# Patient Record
Sex: Female | Born: 1970 | Race: Black or African American | Hispanic: No | Marital: Married | Smoking: Never smoker
Health system: Southern US, Community
[De-identification: ages and names within clinical notes are randomized; demographics above are authoritative.]

## PROBLEM LIST (undated history)

## (undated) DIAGNOSIS — E669 Obesity, unspecified: Secondary | ICD-10-CM

## (undated) DIAGNOSIS — T7840XA Allergy, unspecified, initial encounter: Secondary | ICD-10-CM

## (undated) DIAGNOSIS — I10 Essential (primary) hypertension: Secondary | ICD-10-CM

## (undated) HISTORY — DX: Allergy, unspecified, initial encounter: T78.40XA

---

## 2000-11-07 ENCOUNTER — Ambulatory Visit (HOSPITAL_COMMUNITY): Admission: RE | Admit: 2000-11-07 | Discharge: 2000-11-07 | Payer: Self-pay | Admitting: Obstetrics

## 2000-11-07 ENCOUNTER — Inpatient Hospital Stay (HOSPITAL_COMMUNITY): Admission: AD | Admit: 2000-11-07 | Discharge: 2000-11-09 | Payer: Self-pay | Admitting: Obstetrics

## 2000-11-07 ENCOUNTER — Encounter: Payer: Self-pay | Admitting: Obstetrics

## 2000-11-07 ENCOUNTER — Encounter (INDEPENDENT_AMBULATORY_CARE_PROVIDER_SITE_OTHER): Payer: Self-pay

## 2001-05-21 ENCOUNTER — Ambulatory Visit (HOSPITAL_COMMUNITY): Admission: RE | Admit: 2001-05-21 | Discharge: 2001-05-21 | Payer: Self-pay | Admitting: *Deleted

## 2001-05-24 ENCOUNTER — Encounter: Admission: RE | Admit: 2001-05-24 | Discharge: 2001-05-24 | Payer: Self-pay | Admitting: *Deleted

## 2001-06-07 ENCOUNTER — Encounter: Admission: RE | Admit: 2001-06-07 | Discharge: 2001-06-07 | Payer: Self-pay | Admitting: *Deleted

## 2001-06-21 ENCOUNTER — Encounter: Admission: RE | Admit: 2001-06-21 | Discharge: 2001-06-21 | Payer: Self-pay | Admitting: *Deleted

## 2001-07-02 ENCOUNTER — Ambulatory Visit (HOSPITAL_COMMUNITY): Admission: RE | Admit: 2001-07-02 | Discharge: 2001-07-02 | Payer: Self-pay | Admitting: *Deleted

## 2001-07-05 ENCOUNTER — Encounter: Admission: RE | Admit: 2001-07-05 | Discharge: 2001-07-05 | Payer: Self-pay | Admitting: Internal Medicine

## 2001-07-12 ENCOUNTER — Encounter: Admission: RE | Admit: 2001-07-12 | Discharge: 2001-07-12 | Payer: Self-pay | Admitting: *Deleted

## 2001-07-19 ENCOUNTER — Encounter: Admission: RE | Admit: 2001-07-19 | Discharge: 2001-07-19 | Payer: Self-pay | Admitting: *Deleted

## 2001-07-19 ENCOUNTER — Encounter (HOSPITAL_COMMUNITY): Admission: RE | Admit: 2001-07-19 | Discharge: 2001-08-18 | Payer: Self-pay | Admitting: *Deleted

## 2001-07-26 ENCOUNTER — Encounter: Admission: RE | Admit: 2001-07-26 | Discharge: 2001-07-26 | Payer: Self-pay | Admitting: *Deleted

## 2001-08-02 ENCOUNTER — Encounter: Admission: RE | Admit: 2001-08-02 | Discharge: 2001-08-02 | Payer: Self-pay | Admitting: *Deleted

## 2001-08-24 ENCOUNTER — Encounter (HOSPITAL_COMMUNITY): Admission: RE | Admit: 2001-08-24 | Discharge: 2001-09-23 | Payer: Self-pay | Admitting: *Deleted

## 2001-09-24 ENCOUNTER — Inpatient Hospital Stay (HOSPITAL_COMMUNITY): Admission: AD | Admit: 2001-09-24 | Discharge: 2001-09-26 | Payer: Self-pay | Admitting: Family Medicine

## 2003-01-05 ENCOUNTER — Inpatient Hospital Stay (HOSPITAL_COMMUNITY): Admission: AD | Admit: 2003-01-05 | Discharge: 2003-01-05 | Payer: Self-pay | Admitting: Obstetrics & Gynecology

## 2003-01-28 ENCOUNTER — Encounter: Admission: RE | Admit: 2003-01-28 | Discharge: 2003-01-28 | Payer: Self-pay | Admitting: Obstetrics and Gynecology

## 2003-06-18 ENCOUNTER — Emergency Department (HOSPITAL_COMMUNITY): Admission: EM | Admit: 2003-06-18 | Discharge: 2003-06-18 | Payer: Self-pay | Admitting: Emergency Medicine

## 2004-03-23 ENCOUNTER — Inpatient Hospital Stay (HOSPITAL_COMMUNITY): Admission: AD | Admit: 2004-03-23 | Discharge: 2004-03-27 | Payer: Self-pay | Admitting: Obstetrics

## 2004-03-31 ENCOUNTER — Inpatient Hospital Stay (HOSPITAL_COMMUNITY): Admission: AD | Admit: 2004-03-31 | Discharge: 2004-03-31 | Payer: Self-pay | Admitting: Obstetrics and Gynecology

## 2004-09-03 ENCOUNTER — Ambulatory Visit (HOSPITAL_COMMUNITY): Admission: RE | Admit: 2004-09-03 | Discharge: 2004-09-03 | Payer: Self-pay | Admitting: Obstetrics

## 2004-09-15 ENCOUNTER — Emergency Department (HOSPITAL_COMMUNITY): Admission: EM | Admit: 2004-09-15 | Discharge: 2004-09-15 | Payer: Self-pay | Admitting: Family Medicine

## 2004-09-16 ENCOUNTER — Emergency Department (HOSPITAL_COMMUNITY): Admission: EM | Admit: 2004-09-16 | Discharge: 2004-09-16 | Payer: Self-pay | Admitting: Emergency Medicine

## 2004-09-30 ENCOUNTER — Inpatient Hospital Stay (HOSPITAL_COMMUNITY): Admission: AD | Admit: 2004-09-30 | Discharge: 2004-09-30 | Payer: Self-pay | Admitting: Obstetrics

## 2005-03-20 ENCOUNTER — Emergency Department (HOSPITAL_COMMUNITY): Admission: EM | Admit: 2005-03-20 | Discharge: 2005-03-20 | Payer: Self-pay | Admitting: Family Medicine

## 2006-04-25 ENCOUNTER — Emergency Department (HOSPITAL_COMMUNITY): Admission: EM | Admit: 2006-04-25 | Discharge: 2006-04-25 | Payer: Self-pay | Admitting: Family Medicine

## 2006-09-13 ENCOUNTER — Emergency Department (HOSPITAL_COMMUNITY): Admission: EM | Admit: 2006-09-13 | Discharge: 2006-09-13 | Payer: Self-pay | Admitting: Family Medicine

## 2006-12-24 ENCOUNTER — Emergency Department (HOSPITAL_COMMUNITY): Admission: EM | Admit: 2006-12-24 | Discharge: 2006-12-24 | Payer: Self-pay | Admitting: Emergency Medicine

## 2007-06-02 ENCOUNTER — Emergency Department (HOSPITAL_COMMUNITY): Admission: EM | Admit: 2007-06-02 | Discharge: 2007-06-03 | Payer: Self-pay | Admitting: Emergency Medicine

## 2008-02-19 ENCOUNTER — Encounter (INDEPENDENT_AMBULATORY_CARE_PROVIDER_SITE_OTHER): Payer: Self-pay | Admitting: Obstetrics

## 2008-02-19 ENCOUNTER — Inpatient Hospital Stay (HOSPITAL_COMMUNITY): Admission: RE | Admit: 2008-02-19 | Discharge: 2008-02-22 | Payer: Self-pay | Admitting: Obstetrics

## 2009-06-25 ENCOUNTER — Ambulatory Visit (HOSPITAL_COMMUNITY): Admission: RE | Admit: 2009-06-25 | Discharge: 2009-06-25 | Payer: Self-pay | Admitting: Family Medicine

## 2009-06-26 ENCOUNTER — Ambulatory Visit (HOSPITAL_COMMUNITY): Admission: RE | Admit: 2009-06-26 | Discharge: 2009-06-26 | Payer: Self-pay | Admitting: Obstetrics & Gynecology

## 2010-02-12 ENCOUNTER — Emergency Department (HOSPITAL_COMMUNITY)
Admission: EM | Admit: 2010-02-12 | Discharge: 2010-02-12 | Payer: Self-pay | Source: Home / Self Care | Admitting: Family Medicine

## 2010-02-12 LAB — POCT I-STAT, CHEM 8
BUN: 16 mg/dL (ref 6–23)
Calcium, Ion: 1.15 mmol/L (ref 1.12–1.32)
Chloride: 105 mEq/L (ref 96–112)
Creatinine, Ser: 0.9 mg/dL (ref 0.4–1.2)
Glucose, Bld: 97 mg/dL (ref 70–99)
HCT: 35 % — ABNORMAL LOW (ref 36.0–46.0)
Hemoglobin: 11.9 g/dL — ABNORMAL LOW (ref 12.0–15.0)
Potassium: 4 mEq/L (ref 3.5–5.1)
Sodium: 142 mEq/L (ref 135–145)
TCO2: 28 mmol/L (ref 0–100)

## 2010-02-27 ENCOUNTER — Encounter: Payer: Self-pay | Admitting: *Deleted

## 2010-05-24 LAB — BASIC METABOLIC PANEL
BUN: 6 mg/dL (ref 6–23)
CO2: 22 mEq/L (ref 19–32)
Calcium: 8.9 mg/dL (ref 8.4–10.5)
Chloride: 106 mEq/L (ref 96–112)
Creatinine, Ser: 0.52 mg/dL (ref 0.4–1.2)
GFR calc Af Amer: >60 mL/min (ref >60)
GFR calc non Af Amer: >60 mL/min (ref >60)
Glucose, Bld: 90 mg/dL (ref 70–99)
Potassium: 3.9 mEq/L (ref 3.5–5.1)
Sodium: 136 mEq/L (ref 135–145)

## 2010-05-24 LAB — CBC
HCT: 21.8 % — ABNORMAL LOW (ref 36.0–46.0)
HCT: 36.1 % (ref 36.0–46.0)
Hemoglobin: 11.9 g/dL — ABNORMAL LOW (ref 12.0–15.0)
Hemoglobin: 7.4 g/dL — CL (ref 12.0–15.0)
MCHC: 33.1 g/dL (ref 30.0–36.0)
MCHC: 33.7 g/dL (ref 30.0–36.0)
MCV: 81.9 fL (ref 78.0–100.0)
MCV: 83 fL (ref 78.0–100.0)
Platelets: 200 10*3/uL (ref 150–400)
Platelets: 267 10*3/uL (ref 150–400)
RBC: 2.63 MIL/uL — ABNORMAL LOW (ref 3.87–5.11)
RBC: 4.41 MIL/uL (ref 3.87–5.11)
RDW: 17.3 % — ABNORMAL HIGH (ref 11.5–15.5)
RDW: 18.3 % — ABNORMAL HIGH (ref 11.5–15.5)
WBC: 13.9 10*3/uL — ABNORMAL HIGH (ref 4.0–10.5)
WBC: 8.2 10*3/uL (ref 4.0–10.5)

## 2010-05-24 LAB — GLUCOSE, CAPILLARY
Glucose-Capillary: 105 mg/dL — ABNORMAL HIGH (ref 70–99)
Glucose-Capillary: 109 mg/dL — ABNORMAL HIGH (ref 70–99)
Glucose-Capillary: 120 mg/dL — ABNORMAL HIGH (ref 70–99)
Glucose-Capillary: 124 mg/dL — ABNORMAL HIGH (ref 70–99)
Glucose-Capillary: 190 mg/dL — ABNORMAL HIGH (ref 70–99)

## 2010-05-24 LAB — RPR: RPR Ser Ql: NONREACTIVE

## 2010-06-22 NOTE — Op Note (Signed)
[name], [name]             ACCOUNT NO.:  [id number]   MEDICAL RECORD NO.:  [id number]          PATIENT TYPE:  INP   LOCATION:  9112                          [facility]   PHYSICIAN:  Kathreen Cosier, M.D.DATE OF BIRTH:  March 01, 1970   DATE OF PROCEDURE:  02/19/2008  DATE OF DISCHARGE:                               OPERATIVE REPORT   PREOPERATIVE DIAGNOSES:  1. Intrauterine pregnancy at term.  2. Previous cesarean section.  3. Diabetes.  4. High blood pressure.   POSTOPERATIVE DIAGNOSES:  1. Intrauterine pregnancy at term.  2. Previous cesarean section.  3. Diabetes.  4. High blood pressure.   SURGEON:  Kathreen Cosier, MD.   FIRST ASSISTANT:  Charles A. Clearance Coots, MD   ANESTHESIA:  Spinal.   PROCEDURE:  The patient was placed on the operating table in the supine  position.  Abdomen prepped and draped, bladder emptied with Foley  catheter.  Transverse suprapubic incision made through the old scar,  carried down through the rectus fascia.  Fascia cleaned and incised to  length of the incision.  The recti muscles were retracted laterally.  Peritoneum was incised longitudinally.  Transverse incision made in the  visceral peritoneum above the bladder.  Bladder mobilized inferiorly.  Transverse lower uterine incision made.  The fluid was clear.  The  compound presentation was a vertex and hands presenting.  Vacuum was  applied to the vertex then delivery effected.  She had a female, Apgar 3  and 9, weighing 8 pounds 9 ounces.  The team was in attendance.  The  placenta was posterior, removed manually and sent to pathology.  Uterine  cavity cleaned with dry laps.  Uterine incision closed in 2 layers with  continuous suture of #1 chromic.  Hemostasis was satisfactory.  Bladder  flap reattached with 2-0 chromic.  Uterus well contracted.  Tubes and  ovaries normal.  Abdomen closed in layers, peritoneum continuous suture  of 0-chromic, fascia with continuous suture of  0-Dexon, subcutaneous  tissue closed with 3-0 plain and the skin closed with 4-0 Monocryl.  Blood loss 12 mL.  The patient tolerated the procedure well and taken to  recovery room in good condition.           ______________________________  Kathreen Cosier, M.D.     BAM/MEDQ  D:  02/19/2008  T:  02/20/2008  Job:  [id number]

## 2010-06-25 NOTE — Discharge Summary (Signed)
[place]  Patient:    Samantha Ayers, Samantha Ayers Visit Number: [id number] MRN: [id number]          Service Type: OBS Location: 9300 [id number] Attending Physician:  Venita Sheffield Dictated by:   Kathreen Cosier, M.D. Admit Date:  11/07/2000 Discharge Date: 11/09/2000                             Discharge Summary  HISTORY OF PRESENT ILLNESS:  The patient is a 40 year old prima gravida, EDC by history of December 31, 2000.  She was seen for the first time on November 07, 2000 in my office with no fetal heart.  An ultrasound showed a 32 week intrauterine fetal demise.  The patient states she had some prenatal care in [place], but has been in [place] for between one to two months and had not been to the doctor.  When she came to the office, she had a lab slip that said she had a positive hepatitis screen.  The patient states that she has no history of hepatitis.  PHYSICAL EXAMINATION:  HEENT:  Her sclerae are nonicteric.  LUNGS:  Clear.  HEART:  Regular rhythm, no murmurs, no gallop.  ABDOMEN:  At 30-week size with no fetal heart.  ______ negative.  Pelvic exam revealed cervix was long and closed.  HOSPITAL COURSE:  Cytotec 50 mcg was inserted x2, six hours apart, and the patient started on Pitocin and on November 08, 2000, she delivered double footling macerated breech, which was grossly normal.  Parents refused an autopsy.  The placenta was removed manually and that was after pathology.  The patient had a series of labs in the hospital, none of which are on that chart at the present time.  She was discharged home on November 09, 2000.  FOLLOWUP:  To see Dr. Tad Moore in one week re her hepatitis. To see me in six weeks.  DISCHARGE MEDICATIONS: 1. Tylenol No. 3, 1 q.4. p.r.n. 2. Ferrous sulfate 325 q.d.  DISCHARGE DIAGNOSIS:  Status post intrauterine fetal demise at 32 weeks. Dictated by:   Kathreen Cosier, M.D. Attending  Physician:  Venita Sheffield DD:  11/23/00 TD:  11/23/00 Job: [id number]

## 2010-06-25 NOTE — Discharge Summary (Signed)
[facility]  Patient:    Samantha Ayers, Samantha Ayers Visit Number: [id number] MRN: [id number]          Service Type: OBS Location: 9300 [id number] Attending Physician:  Venita Sheffield Dictated by:   Kathreen Cosier, M.D. Admit Date:  11/07/2000 Disc. Date: 11/09/00                             Discharge Summary  HISTORY OF PRESENT ILLNESS:   The patient is a 40 year old, gravida 1, EDC December 31, 2000 who states that she received prenatal care in [place]. She came with no records, was first seen on November 07, 2000 with a uterus at 32 weeks size, no fetal heart rate and she was sent to the hospital and ultrasound confirmed intrauterine fetal demise. She did have a portion of a record that her hepatitis screen was positive.  HOSPITAL COURSE:              She was admitted and Cytotec 50 mcg was placed x 2, six hours apart, and then she received Pitocin. She had a vaginal delivery on November 08, 2000 of a macerated female double footling breech, skin stripping appearance, refused an autopsy. The placenta was sent to pathology. On admission her hemoglobin was 12.1; postdelivery, was 11.8. Her platelets were 306,000 and 283,000. PT was 11.5 and 14.7. INR 1. PTT 28. Fibrinogen 650. Her hepatitis screen showed that it was positive for hepatitis B. She was O positive.  Postdelivery, the patient was discharged on the first postpartum day, ambulatory on a regular diet.  DISCHARGE FOLLOWUP:           She was given an appointment to see Dr. Dortha Kern for followup in one week. She is to see Dr. Gaynell Face in six weeks.  DISCHARGE MEDICATIONS:        Tylenol #3 and ferrous sulfate 325 mg q.d.  DISCHARGE DIAGNOSES:          1. Status post intrauterine fetal demise at                                  32-34 weeks.                               2. Hepatitis B. Dictated by:   Kathreen Cosier, M.D. Attending Physician:  Venita Sheffield DD:   11/09/00 TD:  11/09/00 Job: [id number]

## 2010-06-25 NOTE — Discharge Summary (Signed)
[name], Samantha Ayers             ACCOUNT NO.:  [id number]   MEDICAL RECORD NO.:  [id number]          PATIENT TYPE:  INP   LOCATION:  9112                          [facility]   PHYSICIAN:  Kathreen Cosier, M.D.DATE OF BIRTH:  May 06, 1970   DATE OF ADMISSION:  02/19/2008  DATE OF DISCHARGE:  02/22/2008                               DISCHARGE SUMMARY   The patient is a 40 year old gravida 4, para 2-1-0-2.  [facility] February 20, 2008 with a history of hypertension, diabetes, and previous C-section.  She was in for repeat C-section, negative GBS.  She had been followed at  [facility] with nonstress tests and ultrasounds.  She was on Norvasc  10 mg p.o. daily and her blood pressures were normal.  She was also on  glyburide 2.5 in the p.m., 5 mg in the a.m.  She underwent a repeat low  transverse cesarean section on February 19, 2008.  She had a female,  Apgar of 3 and 9, weighing 8 pounds 9 ounces.  The placenta was  posterior and sent to pathology.  Uterine cavity cleaned with a dry lap.  Postoperatively, her hemoglobin was 7.4.  She was started on ferrous  sulfate b.i.d.  She did well and was discharged home on the third  postoperative day, ambulatory, on a regular diet, on Tylox for pain,  ampicillin 500 p.o. q.6 h. for 5 days and ferrous sulfate.  She was to  followup in my office on the Wednesday prior to discharge for blood  pressure check.   DISCHARGE DIAGNOSIS:  Status post repeat low transverse cesarean section  at term in a patient with diabetes and chronic hypertension.           ______________________________  Kathreen Cosier, M.D.     BAM/MEDQ  D:  03/19/2008  T:  03/19/2008  Job:  [id number]

## 2010-06-25 NOTE — H&P (Signed)
NAMEMARYL, BLALOCK             ACCOUNT NO.:  [id number]   MEDICAL RECORD NO.:  [id number]          PATIENT TYPE:  INP   LOCATION:  9123                          [facility]   PHYSICIAN:  Kathreen Cosier, M.D.DATE OF BIRTH:  August 28, 1970   DATE OF ADMISSION:  03/23/2004  DATE OF DISCHARGE:                                HISTORY & PHYSICAL   HISTORY OF PRESENT ILLNESS:  The patient is a 40 year old gravida 3, para 1-  1-0-1.  She had an IUFD at 32 weeks in '02.  She has negative GBS.  [facility]  March 31, 2004 and she was followed with nonstress test at [facility].  She was brought in for induction at term because her AFI was  2.8.  Cervix was 1 cm, 50% with the vertex at -3.  Membranes were ruptured;  the fluid was clear.  An IUPC was inserted and she was started on Pitocin  induction.  She progressed slowly initially and by 10 a.m. on March 23, 2004, her cervix was 3 cm, 50% with the vertex at -3 station.  The patient  was in adequate labor all day and by 11:55 p.m., her cervix was unchanged.  During the course of the day, she had an episode of some variable  decelerations and an amnioinfusion was begun with Ringer's lactate; she also  had an epidural on board.  The patient being in adequate labor from 10 a.m.  on March 23, 2004 until 11:55 p.m., it was decided that she would require  a C-section for failure to progress in labor.   PHYSICAL EXAMINATION:  GENERAL:  Physical exam revealed a well-developed  female in labor.  HEENT:  Negative.  LUNGS:  Clear.  HEART:  Regular rhythm.  No murmurs.  No gallops.  ABDOMEN:  Term size.  BREASTS:  Negative.  No masses.  EXTREMITIES:  Negative.      BAM/MEDQ  D:  03/24/2004  T:  03/24/2004  Job:  [id number]

## 2010-06-25 NOTE — Discharge Summary (Signed)
Samantha Ayers, Samantha Ayers             ACCOUNT NO.:  [id number]   MEDICAL RECORD NO.:  [id number]          PATIENT TYPE:  INP   LOCATION:  9123                          [facility]   PHYSICIAN:  Kathreen Cosier, M.D.DATE OF BIRTH:  Sep 20, 1970   DATE OF ADMISSION:  03/23/2004  DATE OF DISCHARGE:  03/27/2004                                 DISCHARGE SUMMARY   HOSPITAL COURSE:  The patient is a 41 year old gravida 3 para 1-1-0-1, her  IUFD at 32 weeks in 2002, negative GBS. Her due date was March 31, 2004.  She was in for induction because her AFI was 2.8. She was followed with  nonstress tests twice weekly, all reactive. On admission, cervix 1 cm, 50%,  vertex, -3. The patient was induced using Pitocin. However, she had  variables with each contraction and did not progress. She got amnioinfusion  and was delivered by C-section because of failure to progress and  nonreassuring fetal heart rate tracing. She had a female, Apgars 8 and 9,  weighing 6 pounds 12 ounces from the OP position. She had a nuchal cord x1.  The patient did well and was discharged on postoperative day #3 ambulatory,  on a regular diet, to see me in 6 weeks. On admission, her hemoglobin was  12.7, postoperative 9.1; platelets 332, postoperative 277. Sodium 139,  potassium 3.6, chloride 106.   DISCHARGE DIAGNOSIS:  Status post low transverse cesarean section for  failure to progress and nonreassuring fetal heart rate tracing.      BAM/MEDQ  D:  05/05/2004  T:  05/05/2004  Job:  [id number]

## 2010-06-25 NOTE — Op Note (Signed)
Samantha Ayers, Samantha Ayers             ACCOUNT NO.:  [id number]   MEDICAL RECORD NO.:  [id number]          PATIENT TYPE:  INP   LOCATION:  9123                          [facility]   PHYSICIAN:  Kathreen Cosier, M.D.DATE OF BIRTH:  26-Oct-1970   DATE OF PROCEDURE:  03/24/2004  DATE OF DISCHARGE:                                 OPERATIVE REPORT   PREOPERATIVE DIAGNOSIS:  Failure to progress in labor.   SURGEON:  Kathreen Cosier, M.D.   ANESTHESIA:  Epidural.   PROCEDURE:  Patient placed on the operating table in supine position,  abdomen prepped and draped, bladder emptied with a Foley catheter.  A  transverse suprapubic incision made, carried down to the rectus fascia, the  fascia cleaned and incised the length of the incision, the recti muscles  retracted laterally and the peritoneum incised longitudinally.  A transverse  incision made above the visceral peritoneum above the bladder and the  bladder mobilized inferiorly.  A transverse low uterine incision made and  the patient delivered from the OP position of a female, Apgar 8 and 9, there  was a nuchal cord, and from the OP position.  The team was in attendance.  The fluid was clear.  The baby weighed 6 pounds 12 ounces.  Placenta fundal,  removed manually.  Uterine cavity cleaned with dry laps.  Uterine incision  closed in one layer with a continuous suture of #1 chromic.  Hemostasis was  satisfactory.  Bladder flap reattached with 2-0 chromic.  The uterus well-  contracted, tubes and ovaries normal.  Abdomen closed in layers, peritoneum  with continuous suture of 0 chromic, fascia with continuous suture of 0  Dexon, and the skin closed with continuous sutures of 4-0 Monocryl.  Blood  loss 600 mL.      BAM/MEDQ  D:  03/24/2004  T:  03/24/2004  Job:  [id number]

## 2010-06-25 NOTE — Group Therapy Note (Signed)
NAME:  Samantha Ayers, Samantha Ayers                       ACCOUNT NO.:  [id number]   MEDICAL RECORD NO.:  [id number]                   PATIENT TYPE:  OUT   LOCATION:  [facility]                           [facility]   PHYSICIAN:  Elsie Lincoln, MD                   DATE OF BIRTH:  1970/05/18   DATE OF SERVICE:  01/28/2003                                    CLINIC NOTE   REASON FOR VISIT:  The patient is a 40 year old G 2, para 1-1-0-1 who was  seen at the [facility] for left lower quadrant pain  during her menses.  Her last menses was January 05, 2003 which is the day  she went to [facility]. The patient states she has been having painful  menses for the past four months.  Before that, the patient states that her  periods were not painful.  She has regular periods that are every 28 to 30  days that last 3 to 4 days.  The patient says the pain is sharp and begins  about 6 days before her period and lasts through her period, so  approximately 10 days of pain total.  The patient denies any other nausea,  vomiting, diarrhea, change in bowel or bladder habits.   PAST MEDICAL HISTORY:  Mild asthma.   PAST SURGICAL HISTORY:  Denies.   PAST GYNECOLOGICAL HISTORY:  NSVD x1 and a fetal demise at 32 weeks with a  subsequent induction of the fetal demise.  No STDs or no abnormal Pap  smears.  However, the patient does not get regular Pap smears.  No history  of thyroid tumors.  Her ultrasound on November 28th showed a 4.6 right  adnexal cyst with low level internal echos immediately adjacent to the right  ovary but definitely separate.  The differential diagnosis per the radiology  report includes the parovarian cyst, endometrioma, or physiologic ovarian  cyst.  The uterus is normal in size and the right ovary is normal.   PHYSICAL EXAMINATION:  VITAL SIGNS:  Today, pulse 86, blood pressure 150/97,  weight 279.  GENERAL:  Well developed, well nourished, in no apparent  distress.  ABDOMEN:  Soft, obese, nontender, nondistended.  PELVIC:  External genitalia Tanner V.  No lesions.  Vagina pink, normal  rugae, no lesions, no blood, no discharge. Cervix closed, nontender.  Uterus  and adnexa are limited secondary to the body habitus but no tenderness is  elicited and no large masses felt.   ASSESSMENT:  A 39 year old female with left lower quadrant pain around the  time of her menstruation with left adnexal mass.   PLAN:  1. Questionably endometriosis given the patient's history of pain in the     luteal phase and during her menses.  2. Will follow up ultrasound in six weeks since last one to see if it is     changed in size.  3. The patient desires  to become pregnant soon so OCPs are not an option for     her.  4. Ibuprofen to begin as soon as period starts and continue through period     to help decrease pain.  5. Return to clinic in six to eight weeks.                                               Elsie Lincoln, MD    KL/MEDQ  D:  01/28/2003  T:  01/28/2003  Job:  [id number]

## 2010-11-02 LAB — POCT CARDIAC MARKERS
CKMB, poc: 1.6
Myoglobin, poc: 59.3
Operator id: 294341
Troponin i, poc: <0.05

## 2010-11-02 LAB — POCT I-STAT, CHEM 8
BUN: 8
Calcium, Ion: 1.07 — ABNORMAL LOW
Chloride: 103
Creatinine, Ser: 0.7
Glucose, Bld: 163 — ABNORMAL HIGH
HCT: 39
Hemoglobin: 13.3
Potassium: 3.5
Sodium: 138
TCO2: 27

## 2010-11-02 LAB — D-DIMER, QUANTITATIVE: D-Dimer, Quant: <0.22

## 2011-05-10 ENCOUNTER — Emergency Department (INDEPENDENT_AMBULATORY_CARE_PROVIDER_SITE_OTHER)
Admission: EM | Admit: 2011-05-10 | Discharge: 2011-05-10 | Disposition: A | Discharge status: Home / Self Care | Payer: Self-pay | Source: Home / Self Care | Attending: Family Medicine | Admitting: Family Medicine

## 2011-05-10 ENCOUNTER — Encounter (HOSPITAL_COMMUNITY): Payer: Self-pay | Admitting: Emergency Medicine

## 2011-05-10 DIAGNOSIS — J309 Allergic rhinitis, unspecified: Secondary | ICD-10-CM

## 2011-05-10 DIAGNOSIS — J302 Other seasonal allergic rhinitis: Secondary | ICD-10-CM

## 2011-05-10 HISTORY — DX: Essential (primary) hypertension: I10

## 2011-05-10 MED ORDER — CETIRIZINE HCL 10 MG PO TABS: 10.0000 mg | ORAL_TABLET | Freq: Every day | ORAL | Status: DC

## 2011-05-10 MED ORDER — FLUTICASONE PROPIONATE 50 MCG/ACT NA SUSP: 2.0000 | Freq: Every day | NASAL | Status: DC

## 2011-05-10 NOTE — ED Notes (Signed)
Pt. Stated, I have a cold since yesterday and my finger tips are numb

## 2011-05-10 NOTE — Discharge Instructions (Signed)
See your doctor this week about your diabetes medicines

## 2011-05-10 NOTE — ED Provider Notes (Signed)
History     CSN: [id number]  Arrival date & time 05/10/11  1947   First MD Initiated Contact with Patient 05/10/11 2008      Chief Complaint  Patient presents with  . URI    (Consider location/radiation/quality/duration/timing/severity/associated sxs/prior treatment) Patient is a 41 y.o. female presenting with URI. The history is provided by the patient.  URI The primary symptoms include sore throat and cough. Primary symptoms do not include fever. The current episode started yesterday. The problem has not changed since onset. Symptoms associated with the illness include congestion and rhinorrhea. Risk factors for severe complications from URI include diabetes mellitus.    Past Medical History  Diagnosis Date  . Diabetes mellitus   . Hypertension   . Asthma     History reviewed. No pertinent past surgical history.  No family history on file.  History  Substance Use Topics  . Smoking status: Not on file  . Smokeless tobacco: Not on file  . Alcohol Use: No    OB History    Grav Para Term Preterm Abortions TAB SAB Ect Mult Living                  Review of Systems  Constitutional: Negative.  Negative for fever.  HENT: Positive for congestion, sore throat, rhinorrhea, sneezing and postnasal drip.   Respiratory: Positive for cough.   Gastrointestinal: Negative.     Allergies  Review of patient's allergies indicates no known allergies.  Home Medications   Current Outpatient Rx  Name Route Sig Dispense Refill  . METFORMIN HCL 1000 MG PO TABS Oral Take 1,000 mg by mouth 2 (two) times daily with a meal.    . CETIRIZINE HCL 10 MG PO TABS Oral Take 1 tablet (10 mg total) by mouth daily. One tab daily for allergies 30 tablet 1  . FLUTICASONE PROPIONATE 50 MCG/ACT NA SUSP Nasal Place 2 sprays into the nose daily. 1 g 2    LMP 04/28/2011  Physical Exam  Nursing note and vitals reviewed. Constitutional: She is oriented to person, place, and time. She appears  well-developed and well-nourished.  HENT:  Head: Normocephalic.  Right Ear: External ear normal.  Left Ear: External ear normal.  Nose: Mucosal edema and rhinorrhea present.  Mouth/Throat: Oropharynx is clear and moist.  Eyes: Conjunctivae are normal. Pupils are equal, round, and reactive to light.  Neck: Normal range of motion. Neck supple.  Lymphadenopathy:    She has no cervical adenopathy.  Neurological: She is alert and oriented to person, place, and time.  Skin: Skin is warm and dry.    ED Course  Procedures (including critical care time)  Labs Reviewed - No data to display No results found.   1. Seasonal allergic rhinitis       MDM         Linna Hoff, MD 05/10/11 2100

## 2012-09-06 ENCOUNTER — Encounter (HOSPITAL_COMMUNITY): Payer: Self-pay | Admitting: Emergency Medicine

## 2012-09-06 DIAGNOSIS — J45901 Unspecified asthma with (acute) exacerbation: Secondary | ICD-10-CM | POA: Insufficient documentation

## 2012-09-06 DIAGNOSIS — E119 Type 2 diabetes mellitus without complications: Secondary | ICD-10-CM | POA: Insufficient documentation

## 2012-09-06 DIAGNOSIS — I10 Essential (primary) hypertension: Secondary | ICD-10-CM | POA: Insufficient documentation

## 2012-09-06 DIAGNOSIS — Z79899 Other long term (current) drug therapy: Secondary | ICD-10-CM | POA: Insufficient documentation

## 2012-09-06 DIAGNOSIS — M940 Chondrocostal junction syndrome [Tietze]: Secondary | ICD-10-CM | POA: Insufficient documentation

## 2012-09-06 DIAGNOSIS — R0602 Shortness of breath: Secondary | ICD-10-CM | POA: Insufficient documentation

## 2012-09-06 DIAGNOSIS — E669 Obesity, unspecified: Secondary | ICD-10-CM | POA: Insufficient documentation

## 2012-09-06 DIAGNOSIS — Z7982 Long term (current) use of aspirin: Secondary | ICD-10-CM | POA: Insufficient documentation

## 2012-09-06 MED ORDER — ALBUTEROL SULFATE (5 MG/ML) 0.5% IN NEBU
5.0000 mg | INHALATION_SOLUTION | Freq: Once | RESPIRATORY_TRACT | Status: AC
  Administered 2012-09-06: 5 mg via RESPIRATORY_TRACT
  Filled 2012-09-06: qty 1

## 2012-09-06 NOTE — ED Notes (Signed)
PT. REPORTS SOB WITH CHEST TIGHTNESS / PRESSURE ONSET THIS EVENING , PT. RAN OUT OF HER ALBUTEROL ER TABS. DENIES FEVER OR CHILLS. OCCASIONAL DRY COUGH.

## 2012-09-07 ENCOUNTER — Emergency Department (HOSPITAL_COMMUNITY): Payer: No Typology Code available for payment source

## 2012-09-07 ENCOUNTER — Emergency Department (HOSPITAL_COMMUNITY)
Admission: EM | Admit: 2012-09-07 | Discharge: 2012-09-07 | Disposition: A | Discharge status: Home / Self Care | Payer: No Typology Code available for payment source | Attending: Emergency Medicine | Admitting: Emergency Medicine

## 2012-09-07 DIAGNOSIS — M940 Chondrocostal junction syndrome [Tietze]: Secondary | ICD-10-CM

## 2012-09-07 DIAGNOSIS — J4520 Mild intermittent asthma, uncomplicated: Secondary | ICD-10-CM

## 2012-09-07 HISTORY — DX: Obesity, unspecified: E66.9

## 2012-09-07 MED ORDER — HYDRALAZINE HCL 20 MG/ML IJ SOLN
INTRAMUSCULAR | Status: AC
  Filled 2012-09-07: qty 1

## 2012-09-07 MED ORDER — ALBUTEROL SULFATE HFA 108 (90 BASE) MCG/ACT IN AERS
2.0000 | INHALATION_SPRAY | RESPIRATORY_TRACT | Status: DC | PRN
  Filled 2012-09-07: qty 6.7

## 2012-09-07 MED ORDER — TOBRAMYCIN SULFATE 1.2 G IJ SOLR
INTRAMUSCULAR | Status: AC
  Filled 2012-09-07: qty 1.2

## 2012-09-07 MED ORDER — HYDROMORPHONE HCL PF 1 MG/ML IJ SOLN
INTRAMUSCULAR | Status: AC
  Filled 2012-09-07: qty 2

## 2012-09-07 MED ORDER — NAPROXEN SODIUM 220 MG PO TABS: ORAL_TABLET | ORAL | Status: DC

## 2012-09-07 MED ORDER — MIDAZOLAM HCL 2 MG/2ML IJ SOLN
INTRAMUSCULAR | Status: AC
  Filled 2012-09-07: qty 6

## 2012-09-07 MED ORDER — FENTANYL CITRATE 0.05 MG/ML IJ SOLN
INTRAMUSCULAR | Status: AC
  Filled 2012-09-07: qty 4

## 2012-09-07 MED ORDER — IBUPROFEN 800 MG PO TABS
800.0000 mg | ORAL_TABLET | Freq: Once | ORAL | Status: AC
  Administered 2012-09-07: 800 mg via ORAL
  Filled 2012-09-07: qty 1

## 2012-09-07 NOTE — ED Notes (Signed)
Patient transported to X-ray 

## 2012-09-07 NOTE — ED Provider Notes (Signed)
CSN: [id number]     Arrival date & time 09/06/12  2251 History     First MD Initiated Contact with Patient 09/07/12 0128     Chief Complaint  Patient presents with  . Shortness of Breath   (Consider location/radiation/quality/duration/timing/severity/associated sxs/prior Treatment) HPI This is a 42 year old female with a history of asthma. She complains of pain in her chest since yesterday morning. It worsened throughout the day. It is a sharp pain, well localized and adjacent to the left side of the upper sternum. It is worse with deep breathing, movement or palpation. It was earlier associated with shortness of breath and wheezing, made worse by the patient being out of her albuterol tablets. She was wheezing in triage and was given an albuterol treatment with improvement in her shortness of breath. She denies a cough. She denies fever or chills.   Past Medical History  Diagnosis Date  . Diabetes mellitus   . Hypertension   . Asthma   . Obesity    History reviewed. No pertinent past surgical history. No family history on file. History  Substance Use Topics  . Smoking status: Not on file  . Smokeless tobacco: Not on file  . Alcohol Use: No   OB History   Grav Para Term Preterm Abortions TAB SAB Ect Mult Living                 Review of Systems  All other systems reviewed and are negative.    Allergies  Review of patient's allergies indicates no known allergies.  Home Medications   Current Outpatient Rx  Name  Route  Sig  Dispense  Refill  . albuterol (PROVENTIL) 4 MG tablet   Oral   Take 4 mg by mouth 2 (two) times daily.         Marland Kitchen aspirin 325 MG tablet   Oral   Take 325 mg by mouth daily.         Marland Kitchen lisinopril-hydrochlorothiazide (PRINZIDE,ZESTORETIC) 20-12.5 MG per tablet   Oral   Take 1 tablet by mouth daily.         Marland Kitchen loratadine (CLARITIN) 10 MG tablet   Oral   Take 10 mg by mouth daily.         . metFORMIN (GLUCOPHAGE) 500 MG tablet    Oral   Take 500 mg by mouth 2 (two) times daily with a meal.          BP 119/58  Pulse 101  Temp(Src) 99 F (37.2 C) (Oral)  Resp 27  SpO2 99%  LMP 08/11/2012  Physical Exam General: Well-developed, well-nourished female in no acute distress; appearance consistent with age of record HENT: normocephalic, atraumatic Eyes: pupils equal round and reactive to light; extraocular muscles intact Neck: supple Heart: regular rate and rhythm; no murmurs, rubs or gallops Lungs: clear to auscultation bilaterally Chest: left upper parasternal tenderness Abdomen: soft; nondistended; nontender; bowel sounds present Extremities: No deformity; full range of motion; pulses normal Neurologic: Awake, alert and oriented; motor function intact in all extremities and symmetric; no facial droop Skin: Warm and dry Psychiatric: Normal mood and affect    ED Course   Procedures (including critical care time)    MDM  Nursing notes and vitals signs, including pulse oximetry, reviewed.  Summary of this visit's results, reviewed by myself:  Imaging Studies: Dg Chest 2 View  09/07/2012   *RADIOLOGY REPORT*  Clinical Data: Chest pain and shortness of breath for 1 day.  CHEST - 2  VIEW  Comparison: 06/18/2003.  Findings: No significant osseous abnormality.  Lungs are clear. No effusion or pneumothorax.  Cardiomediastinal size and contour are within normal limits.  The upper abdomen is unremarkable.  IMPRESSION: No evidence of acute cardiopulmonary disease.   Original Report Authenticated By: Tiburcio Pea     Date: 09/06/2012 11:12 PM  Rate: 95  Rhythm: normal sinus rhythm  QRS Axis: normal  Intervals: normal  ST/T Wave abnormalities: normal  Conduction Disutrbances: none  Narrative Interpretation: unremarkable  Comparison with previous EKG: Rate is slower  Examination consistent with costochondritis. We will refill her albuterol inhaler.    Hanley Seamen, MD 09/07/12 [phone]

## 2013-04-15 ENCOUNTER — Emergency Department (HOSPITAL_COMMUNITY)
Admission: EM | Admit: 2013-04-15 | Discharge: 2013-04-15 | Disposition: A | Discharge status: Home / Self Care | Payer: No Typology Code available for payment source | Source: Home / Self Care

## 2013-04-15 ENCOUNTER — Encounter (HOSPITAL_COMMUNITY): Payer: Self-pay | Admitting: Emergency Medicine

## 2013-04-15 DIAGNOSIS — M5136 Other intervertebral disc degeneration, lumbar region: Secondary | ICD-10-CM

## 2013-04-15 DIAGNOSIS — S335XXA Sprain of ligaments of lumbar spine, initial encounter: Secondary | ICD-10-CM

## 2013-04-15 DIAGNOSIS — S39012A Strain of muscle, fascia and tendon of lower back, initial encounter: Secondary | ICD-10-CM

## 2013-04-15 DIAGNOSIS — M940 Chondrocostal junction syndrome [Tietze]: Secondary | ICD-10-CM

## 2013-04-15 DIAGNOSIS — M5137 Other intervertebral disc degeneration, lumbosacral region: Secondary | ICD-10-CM

## 2013-04-15 DIAGNOSIS — R0789 Other chest pain: Secondary | ICD-10-CM

## 2013-04-15 DIAGNOSIS — R071 Chest pain on breathing: Secondary | ICD-10-CM

## 2013-04-15 LAB — POCT URINALYSIS DIP (DEVICE)
Bilirubin Urine: NEGATIVE
GLUCOSE, UA: NEGATIVE mg/dL
KETONES UR: NEGATIVE mg/dL
Nitrite: NEGATIVE
Protein, ur: NEGATIVE mg/dL
SPECIFIC GRAVITY, URINE: 1.015 (ref 1.005–1.030)
UROBILINOGEN UA: 0.2 mg/dL (ref 0.0–1.0)
pH: 6 (ref 5.0–8.0)

## 2013-04-15 MED ORDER — TRAMADOL HCL 50 MG PO TABS: 50.0000 mg | ORAL_TABLET | Freq: Four times a day (QID) | ORAL | Status: DC | PRN

## 2013-04-15 MED ORDER — MELOXICAM 15 MG PO TABS: 15.0000 mg | ORAL_TABLET | Freq: Every day | ORAL | Status: DC

## 2013-04-15 NOTE — ED Provider Notes (Signed)
Medical screening examination/treatment/procedure(s) were performed by non-physician practitioner and as supervising physician I was immediately available for consultation/collaboration.  Leslee Homeavid Kylil Swopes, M.D.  Reuben Likesavid C Fahima Cifelli, MD 04/15/13 1430

## 2013-04-15 NOTE — ED Provider Notes (Signed)
CSN: [id number]     Arrival date & time 04/15/13  [id number] History   First MD Initiated Contact with Patient 04/15/13 1106     Chief Complaint  Patient presents with  . Back Pain  . Abdominal Pain   (Consider location/radiation/quality/duration/timing/severity/associated sxs/prior Treatment) HPI Comments: 43 year old African female complaining of a 2 day history of pain in the lower lumbar spine. The pain is exacerbated and elicited by standing from a seated position, walking, sitting or arising from a supine position. Most movements tends to produce this nonradiating pain. Second complaint is that of pain in the left lower sternal border, xyphoid, and left lower ribs. The pain is often worse with certain positions and when she lets out a deep breath and leans forward. Denies any known injury, trauma or event that would have caused the pain.   Past Medical History  Diagnosis Date  . Diabetes mellitus   . Hypertension   . Asthma   . Obesity    History reviewed. No pertinent past surgical history. History reviewed. No pertinent family history. History  Substance Use Topics  . Smoking status: Never Smoker   . Smokeless tobacco: Not on file  . Alcohol Use: No   OB History   Grav Para Term Preterm Abortions TAB SAB Ect Mult Living                 Review of Systems  Constitutional: Positive for activity change. Negative for fever, appetite change and fatigue.  HENT: Negative.   Respiratory: Negative.   Cardiovascular: Negative.   Gastrointestinal:       Occasional GERD symptoms  Genitourinary: Negative.  Negative for dysuria, frequency, flank pain and pelvic pain.  Musculoskeletal: Positive for back pain.  Skin: Negative for rash.  Neurological: Negative.  Negative for syncope, facial asymmetry, speech difficulty and headaches.       No focal paresthesias or weakness.    Allergies  Review of patient's allergies indicates no known allergies.  Home Medications   Current  Outpatient Rx  Name  Route  Sig  Dispense  Refill  . lisinopril-hydrochlorothiazide (PRINZIDE,ZESTORETIC) 20-12.5 MG per tablet   Oral   Take 1 tablet by mouth daily.         . metFORMIN (GLUCOPHAGE) 500 MG tablet   Oral   Take 500 mg by mouth 2 (two) times daily with a meal.         . albuterol (PROVENTIL) 4 MG tablet   Oral   Take 4 mg by mouth 2 (two) times daily.         Marland Kitchen. aspirin 325 MG tablet   Oral   Take 325 mg by mouth daily.         Marland Kitchen. loratadine (CLARITIN) 10 MG tablet   Oral   Take 10 mg by mouth daily.         . meloxicam (MOBIC) 15 MG tablet   Oral   Take 1 tablet (15 mg total) by mouth daily.   14 tablet   0   . traMADol (ULTRAM) 50 MG tablet   Oral   Take 1 tablet (50 mg total) by mouth every 6 (six) hours as needed.   15 tablet   0    BP 132/66  Pulse 93  Temp(Src) 98.3 F (36.8 C) (Oral)  Resp 20  SpO2 100%  LMP 04/13/2013 Physical Exam  Nursing note and vitals reviewed. Constitutional: She is oriented to person, place, and time. She appears well-developed and well-nourished.  No distress.  Severely and morbidly obese  HENT:  Head: Normocephalic and atraumatic.  Eyes: EOM are normal. Pupils are equal, round, and reactive to light.  Neck: Normal range of motion. Neck supple.  Cardiovascular: Normal rate, regular rhythm and normal heart sounds.   Pulmonary/Chest: Effort normal and breath sounds normal. No respiratory distress. She has no wheezes. She exhibits tenderness.  Marked, reproducible tenderness in the left chest wall along the lower parasternal border, xiphoid and left lower costal margin. Patient states that palpation of these areas reproduces the pain for which he presents.  Abdominal: Soft. She exhibits no distension and no mass. There is no tenderness. There is no rebound and no guarding.  Musculoskeletal: She exhibits no edema.  Neurological: She is alert and oriented to person, place, and time. No cranial nerve deficit.   Skin: Skin is warm and dry. No rash noted.  Psychiatric: She has a normal mood and affect.    ED Course  Procedures (including critical care time) Labs Review Labs Reviewed  POCT URINALYSIS DIP (DEVICE) - Abnormal; Notable for the following:    Hgb urine dipstick LARGE (*)    Leukocytes, UA TRACE (*)    All other components within normal limits   Imaging Review No results found.   MDM   1. Lumbar strain   2. Lumbar degenerative disc disease   3. Chest wall pain   4. Costochondritis     Ice to chest, heat to lower back; if not helping try ice mobic for pain Tramadol Call your doctor for F/U. Wll also need to have urine rechecked in a couple of weeks No urinary sx's. Return if develop urinary problems.     Hayden Rasmussen, NP 04/15/13 1140

## 2013-04-15 NOTE — ED Notes (Signed)
C/o lower back pain and upper left quadrant pain.  On set last night.  No relief with advil.  Denies fever, n/v/d.

## 2013-04-15 NOTE — Discharge Instructions (Signed)
Back Pain, Adult Low back pain is very common. About 1 in 5 people have back pain.The cause of low back pain is rarely dangerous. The pain often gets better over time.About half of people with a sudden onset of back pain feel better in just 2 weeks. About 8 in 10 people feel better by 6 weeks.  CAUSES Some common causes of back pain include:  Strain of the muscles or ligaments supporting the spine.  Wear and tear (degeneration) of the spinal discs.  Arthritis.  Direct injury to the back. DIAGNOSIS Most of the time, the direct cause of low back pain is not known.However, back pain can be treated effectively even when the exact cause of the pain is unknown.Answering your caregiver's questions about your overall health and symptoms is one of the most accurate ways to make sure the cause of your pain is not dangerous. If your caregiver needs more information, he or she may order lab work or imaging tests (X-rays or MRIs).However, even if imaging tests show changes in your back, this usually does not require surgery. HOME CARE INSTRUCTIONS For many people, back pain returns.Since low back pain is rarely dangerous, it is often a condition that people can learn to [facility] their own.   Remain active. It is stressful on the back to sit or stand in one place. Do not sit, drive, or stand in one place for more than 30 minutes at a time. Take short walks on level surfaces as soon as pain allows.Try to increase the length of time you walk each day.  Do not stay in bed.Resting more than 1 or 2 days can delay your recovery.  Do not avoid exercise or work.Your body is made to move.It is not dangerous to be active, even though your back may hurt.Your back will likely heal faster if you return to being active before your pain is gone.  Pay attention to your body when you bend and lift. Many people have less discomfortwhen lifting if they bend their knees, keep the load close to their bodies,and  avoid twisting. Often, the most comfortable positions are those that put less stress on your recovering back.  Find a comfortable position to sleep. Use a firm mattress and lie on your side with your knees slightly bent. If you lie on your back, put a pillow under your knees.  Only take over-the-counter or prescription medicines as directed by your caregiver. Over-the-counter medicines to reduce pain and inflammation are often the most helpful.Your caregiver may prescribe muscle relaxant drugs.These medicines help dull your pain so you can more quickly return to your normal activities and healthy exercise.  Put ice on the injured area.  Put ice in a plastic bag.  Place a towel between your skin and the bag.  Leave the ice on for 15-20 minutes, 03-04 times a day for the first 2 to 3 days. After that, ice and heat may be alternated to reduce pain and spasms.  Ask your caregiver about trying back exercises and gentle massage. This may be of some benefit.  Avoid feeling anxious or stressed.Stress increases muscle tension and can worsen back pain.It is important to recognize when you are anxious or stressed and learn ways to manage it.Exercise is a great option. SEEK MEDICAL CARE IF:  You have pain that is not relieved with rest or medicine.  You have pain that does not improve in 1 week.  You have new symptoms.  You are generally not feeling well. SEEK  IMMEDIATE MEDICAL CARE IF:   You have pain that radiates from your back into your legs.  You develop new bowel or bladder control problems.  You have unusual weakness or numbness in your arms or legs.  You develop nausea or vomiting.  You develop abdominal pain.  You feel faint. Document Released: 01/24/2005 Document Revised: 07/26/2011 Document Reviewed: 06/14/2010 [facility] Patient Information 2014 [place], [place].  Chest Wall Pain Chest wall pain is pain in or around the bones and muscles of your chest. It may take up to 6  weeks to get better. It may take longer if you must stay physically active in your work and activities.  CAUSES  Chest wall pain may happen on its own. However, it may be caused by:  A viral illness like the flu.  Injury.  Coughing.  Exercise.  Arthritis.  Fibromyalgia.  Shingles. HOME CARE INSTRUCTIONS   Avoid overtiring physical activity. Try not to strain or perform activities that cause pain. This includes any activities using your chest or your abdominal and side muscles, especially if heavy weights are used.  Put ice on the sore area.  Put ice in a plastic bag.  Place a towel between your skin and the bag.  Leave the ice on for 15-20 minutes per hour while awake for the first 2 days.  Only take over-the-counter or prescription medicines for pain, discomfort, or fever as directed by your caregiver. SEEK IMMEDIATE MEDICAL CARE IF:   Your pain increases, or you are very uncomfortable.  You have a fever.  Your chest pain becomes worse.  You have new, unexplained symptoms.  You have nausea or vomiting.  You feel sweaty or lightheaded.  You have a cough with phlegm (sputum), or you cough up blood. MAKE SURE YOU:   Understand these instructions.  Will watch your condition.  Will get help right away if you are not doing well or get worse. Document Released: 01/24/2005 Document Revised: 04/18/2011 Document Reviewed: 09/20/2010 [facility] Patient Information 2014 [place]Care, [place].  Costochondritis Costochondritis is a condition in which the tissue (cartilage) that connects your ribs with your breastbone (sternum) becomes irritated. It causes pain in the chest and rib area. It usually goes away on its own over time. HOME CARE  Avoid activities that wear you out.  Do not strain your ribs. Avoid activities that use your:  Chest.  Belly.  Side muscles.  Put ice on the area for the first 2 days after the pain starts.  Put ice in a plastic bag.  Place a  towel between your skin and the bag.  Leave the ice on for 20 minutes, 2 3 times a day.  Only take medicine as told by your doctor. GET HELP IF:  You have redness or puffiness (swelling) in the rib area.  Your pain does not go away with rest or medicine. GET HELP RIGHT AWAY IF:   Your pain gets worse.  You are very uncomfortable.  You have trouble breathing.  You cough up blood.  You start sweating or throwing up (vomiting).  You have a fever or lasting symptoms for more than 2 3 days.  You have a fever and your symptoms suddenly get worse. MAKE SURE YOU:   Understand these instructions.  Will watch your condition.  Will get help right away if you are not doing well or get worse. Document Released: 07/13/2007 Document Revised: 09/26/2012 Document Reviewed: 08/28/2012 [facility] Patient Information 2014 [place], [place].  Degenerative Disk Disease Degenerative disk disease  is a condition caused by the changes that occur in the cushions of the backbone (spinal disks) as you grow older. Spinal disks are soft and compressible disks located between the bones of the spine (vertebrae). They act like shock absorbers. Degenerative disk disease can affect the whole spine. However, the neck and lower back are most commonly affected. Many changes can occur in the spinal disks with aging, such as:  The spinal disks may dry and shrink.  Small tears may occur in the tough, outer covering of the disk (annulus).  The disk space may become smaller due to loss of water.  Abnormal growths in the bone (spurs) may occur. This can put pressure on the nerve roots exiting the spinal canal, causing pain.  The spinal canal may become narrowed. CAUSES  Degenerative disk disease is a condition caused by the changes that occur in the spinal disks with aging. The exact cause is not known, but there is a genetic basis for many patients. Degenerative changes can occur due to loss of fluid in the disk.  This makes the disk thinner and reduces the space between the backbones. Small cracks can develop in the outer layer of the disk. This can lead to the breakdown of the disk. You are more likely to get degenerative disk disease if you are overweight. Smoking cigarettes and doing heavy work such as weightlifting can also increase your risk of this condition. Degenerative changes can start after a sudden injury. Growth of bone spurs can compress the nerve roots and cause pain.  SYMPTOMS  The symptoms vary from person to person. Some people may have no pain, while others have severe pain. The pain may be so severe that it can limit your activities. The location of the pain depends on the part of your backbone that is affected. You will have neck or arm pain if a disk in the neck area is affected. You will have pain in your back, buttocks, or legs if a disk in the lower back is affected. The pain becomes worse while bending, reaching up, or with twisting movements. The pain may start gradually and then get worse as time passes. It may also start after a major or minor injury. You may feel numbness or tingling in the arms or legs.  DIAGNOSIS  Your caregiver will ask you about your symptoms and about activities or habits that may cause the pain. He or she may also ask about any injuries, diseases, or treatments you have had earlier. Your caregiver will examine you to check for the range of movement that is possible in the affected area, to check for strength in your extremities, and to check for sensation in the areas of the arms and legs supplied by different nerve roots. An X-ray of the spine may be taken. Your caregiver may suggest other imaging tests, such as magnetic resonance imaging (MRI), if needed.  TREATMENT  Treatment includes rest, modifying your activities, and applying ice and heat. Your caregiver may prescribe medicines to reduce your pain and may ask you to do some exercises to strengthen your back.  In some cases, you may need surgery. You and your caregiver will decide on the treatment that is best for you. HOME CARE INSTRUCTIONS   Follow proper lifting and walking techniques as advised by your caregiver.  Maintain good posture.  Exercise regularly as advised.  Perform relaxation exercises.  Change your sitting, standing, and sleeping habits as advised. Change positions frequently.  Lose weight as  advised.  Stop smoking if you smoke.  Wear supportive footwear. SEEK MEDICAL CARE IF:  Your pain does not go away within 1 to 4 weeks. SEEK IMMEDIATE MEDICAL CARE IF:   Your pain is severe.  You notice weakness in your arms, hands, or legs.  You begin to lose control of your bladder or bowel movements. MAKE SURE YOU:   Understand these instructions.  Will watch your condition.  Will get help right away if you are not doing well or get worse. Document Released: 11/21/2006 Document Revised: 04/18/2011 Document Reviewed: 11/21/2006 [facility] Patient Information 2014 [place], [place].  Lumbosacral Strain Lumbosacral strain is a strain of any of the parts that make up your lumbosacral vertebrae. Your lumbosacral vertebrae are the bones that make up the lower third of your backbone. Your lumbosacral vertebrae are held together by muscles and tough, fibrous tissue (ligaments).  CAUSES  A sudden blow to your back can cause lumbosacral strain. Also, anything that causes an excessive stretch of the muscles in the low back can cause this strain. This is typically seen when people exert themselves strenuously, fall, lift heavy objects, bend, or crouch repeatedly. RISK FACTORS  Physically demanding work.  Participation in pushing or pulling sports or sports that require sudden twist of the back (tennis, golf, baseball).  Weight lifting.  Excessive lower back curvature.  Forward-tilted pelvis.  Weak back or abdominal muscles or both.  Tight hamstrings. SIGNS AND SYMPTOMS    Lumbosacral strain may cause pain in the area of your injury or pain that moves (radiates) down your leg.  DIAGNOSIS Your health care provider can often diagnose lumbosacral strain through a physical exam. In some cases, you may need tests such as X-ray exams.  TREATMENT  Treatment for your lower back injury depends on many factors that your clinician will have to evaluate. However, most treatment will include the use of anti-inflammatory medicines. HOME CARE INSTRUCTIONS   Avoid hard physical activities (tennis, racquetball, waterskiing) if you are not in proper physical condition for it. This may aggravate or create problems.  If you have a back problem, avoid sports requiring sudden body movements. Swimming and walking are generally safer activities.  Maintain good posture.  Maintain a healthy weight.  For acute conditions, you may put ice on the injured area.  Put ice in a plastic bag.  Place a towel between your skin and the bag.  Leave the ice on for 20 minutes, 2 3 times a day.  When the low back starts healing, stretching and strengthening exercises may be recommended. SEEK MEDICAL CARE IF:  Your back pain is getting worse.  You experience severe back pain not relieved with medicines. SEEK IMMEDIATE MEDICAL CARE IF:   You have numbness, tingling, weakness, or problems with the use of your arms or legs.  There is a change in bowel or bladder control.  You have increasing pain in any area of the body, including your belly (abdomen).  You notice shortness of breath, dizziness, or feel faint.  You feel sick to your stomach (nauseous), are throwing up (vomiting), or become sweaty.  You notice discoloration of your toes or legs, or your feet get very cold. MAKE SURE YOU:   Understand these instructions.  Will watch your condition.  Will get help right away if you are not doing well or get worse. Document Released: 11/03/2004 Document Revised: 11/14/2012 Document  Reviewed: 09/12/2012 [facility] Patient Information 2014 [place], [place].

## 2013-09-17 ENCOUNTER — Other Ambulatory Visit (HOSPITAL_COMMUNITY): Payer: Self-pay | Admitting: Physician Assistant

## 2013-09-17 DIAGNOSIS — Z1231 Encounter for screening mammogram for malignant neoplasm of breast: Secondary | ICD-10-CM

## 2013-09-18 ENCOUNTER — Ambulatory Visit (HOSPITAL_COMMUNITY)
Admission: RE | Admit: 2013-09-18 | Discharge: 2013-09-18 | Disposition: A | Discharge status: Home / Self Care | Payer: No Typology Code available for payment source | Source: Ambulatory Visit | Attending: Physician Assistant | Admitting: Physician Assistant

## 2013-09-18 DIAGNOSIS — Z1231 Encounter for screening mammogram for malignant neoplasm of breast: Secondary | ICD-10-CM | POA: Insufficient documentation

## 2014-08-17 ENCOUNTER — Encounter (HOSPITAL_COMMUNITY): Payer: Self-pay | Admitting: Emergency Medicine

## 2014-08-17 ENCOUNTER — Emergency Department (HOSPITAL_COMMUNITY)
Admission: EM | Admit: 2014-08-17 | Discharge: 2014-08-17 | Disposition: A | Discharge status: Home / Self Care | Payer: Self-pay | Source: Home / Self Care | Attending: Emergency Medicine | Admitting: Emergency Medicine

## 2014-08-17 DIAGNOSIS — H1013 Acute atopic conjunctivitis, bilateral: Secondary | ICD-10-CM

## 2014-08-17 DIAGNOSIS — S161XXA Strain of muscle, fascia and tendon at neck level, initial encounter: Secondary | ICD-10-CM

## 2014-08-17 MED ORDER — CYCLOBENZAPRINE HCL 10 MG PO TABS: 10.0000 mg | ORAL_TABLET | Freq: Three times a day (TID) | ORAL | Status: DC | PRN

## 2014-08-17 MED ORDER — AZELASTINE HCL 0.05 % OP SOLN: 1.0000 [drp] | Freq: Two times a day (BID) | OPHTHALMIC | Status: DC

## 2014-08-17 MED ORDER — DICLOFENAC SODIUM 75 MG PO TBEC: 75.0000 mg | DELAYED_RELEASE_TABLET | Freq: Two times a day (BID) | ORAL | Status: DC

## 2014-08-17 MED ORDER — TRAMADOL HCL 50 MG PO TABS: 50.0000 mg | ORAL_TABLET | Freq: Four times a day (QID) | ORAL | Status: DC | PRN

## 2014-08-17 NOTE — ED Notes (Signed)
Pt comes in with left neck stiff pain  Started 3 dys ago Ibuprofen and Tylenol not working  [facility]

## 2014-08-17 NOTE — Discharge Instructions (Signed)
You have strained one of the muscles in your neck. Apply a cold compress for 20 minutes followed by warm compress for 20 minutes. Do this at least 3 times a day. Take diclofenac twice a day for the next week, then as needed. Use the Flexeril at bedtime for the next week, then as needed. This medicine will make you sleepy. Do gentle stretching of your neck. Use the tramadol every 6-8 hours as needed for severe pain. Do not drive all taking this medicine. This should improve in the next 2-3 days, but will take another week or 2 to fully heal.  Use the azelastine eyedrops twice a day for your eyes. Get over-the-counter artificial tears. Use this every 2-3 hours for the next week.  Follow-up as needed.

## 2014-08-17 NOTE — ED Provider Notes (Signed)
CSN: [id number]     Arrival date & time 08/17/14  1302 History   First MD Initiated Contact with Patient 08/17/14 1321     Chief Complaint  Patient presents with  . Neck Pain   (Consider location/radiation/quality/duration/timing/severity/associated sxs/prior Treatment) HPI She is a 44 year old woman here for evaluation of left-sided neck pain. This started 3-4 days ago. She denies any triggering or inciting event. No recent trauma or falls. No change in activity or heavy lifting. No new mattress or pillows. The pain has stayed about the same. It is worse with flexion, extension and rotation. She has tried Tylenol and ibuprofen without improvement.  She describes the pain as a stiffness.  She also reports a one-month history of eye redness and watering. She denies frank itching. No change in her vision. She was previously diagnosed as allergic conjunctivitis. She has tried Zyrtec and loratadine without improvement. She also tried using her child's allergic eyedrops without improvement.  Past Medical History  Diagnosis Date  . Diabetes mellitus   . Hypertension   . Asthma   . Obesity    History reviewed. No pertinent past surgical history. No family history on file. History  Substance Use Topics  . Smoking status: Never Smoker   . Smokeless tobacco: Not on file  . Alcohol Use: No   OB History    No data available     Review of Systems As in history of present illness Allergies  Review of patient's allergies indicates no known allergies.  Home Medications   Prior to Admission medications   Medication Sig Start Date End Date Taking? Authorizing Provider  albuterol (PROVENTIL) 4 MG tablet Take 4 mg by mouth 2 (two) times daily.    Historical Provider, MD  aspirin 325 MG tablet Take 325 mg by mouth daily.    Historical Provider, MD  azelastine (OPTIVAR) 0.05 % ophthalmic solution Place 1 drop into both eyes 2 (two) times daily. 08/17/14   Charm Rings, MD  cyclobenzaprine  (FLEXERIL) 10 MG tablet Take 1 tablet (10 mg total) by mouth 3 (three) times daily as needed for muscle spasms. 08/17/14   Charm Rings, MD  diclofenac (VOLTAREN) 75 MG EC tablet Take 1 tablet (75 mg total) by mouth 2 (two) times daily. For 1 week, then as needed 08/17/14   Charm Rings, MD  lisinopril-hydrochlorothiazide (PRINZIDE,ZESTORETIC) 20-12.5 MG per tablet Take 1 tablet by mouth daily.    Historical Provider, MD  loratadine (CLARITIN) 10 MG tablet Take 10 mg by mouth daily.    Historical Provider, MD  metFORMIN (GLUCOPHAGE) 500 MG tablet Take 500 mg by mouth 2 (two) times daily with a meal.    Historical Provider, MD  traMADol (ULTRAM) 50 MG tablet Take 1 tablet (50 mg total) by mouth every 6 (six) hours as needed. 08/17/14   Charm Rings, MD   BP 128/64 mmHg  Pulse 104  Temp(Src) 98.1 F (36.7 C) (Oral)  SpO2 97%  LMP 08/09/2014 Physical Exam  Constitutional: She is oriented to person, place, and time. She appears well-developed and well-nourished. No distress.  Eyes:  Bilateral conjunctival injection. She also has clear drainage from her eyes.  Neck: Neck supple.  Range of motion limited due to pain. She is tender along the left sternocleidomastoid muscle.  Cardiovascular: Normal rate.   Pulmonary/Chest: Effort normal.  Neurological: She is alert and oriented to person, place, and time.    ED Course  Procedures (including critical care time) Labs Review Labs  Reviewed - No data to display  Imaging Review No results found.   MDM   1. Neck muscle strain, initial encounter   2. Allergic conjunctivitis, bilateral    Conservative management for neck strain with ice/heat, diclofenac, Flexeril. Tramadol for severe pain.  Will try Azelastine eye drops and artificial tears for allergic conjunctivitis.  Follow-up as needed.    Charm RingsErin J Cozetta Seif, MD 08/17/14 1344

## 2014-11-17 ENCOUNTER — Ambulatory Visit: Payer: Self-pay | Admitting: Internal Medicine

## 2015-08-10 ENCOUNTER — Ambulatory Visit (INDEPENDENT_AMBULATORY_CARE_PROVIDER_SITE_OTHER): Payer: BLUE CROSS/BLUE SHIELD | Admitting: Family

## 2015-08-10 ENCOUNTER — Other Ambulatory Visit (INDEPENDENT_AMBULATORY_CARE_PROVIDER_SITE_OTHER): Payer: BLUE CROSS/BLUE SHIELD

## 2015-08-10 ENCOUNTER — Ambulatory Visit: Payer: Self-pay | Admitting: Family

## 2015-08-10 ENCOUNTER — Encounter: Payer: Self-pay | Admitting: Family

## 2015-08-10 VITALS — BP 118/70 | HR 78 | Temp 98.4°F | Resp 16 | Wt 253.0 lb

## 2015-08-10 DIAGNOSIS — I10 Essential (primary) hypertension: Secondary | ICD-10-CM

## 2015-08-10 DIAGNOSIS — M7661 Achilles tendinitis, right leg: Secondary | ICD-10-CM | POA: Diagnosis not present

## 2015-08-10 DIAGNOSIS — M25561 Pain in right knee: Secondary | ICD-10-CM | POA: Diagnosis not present

## 2015-08-10 DIAGNOSIS — E119 Type 2 diabetes mellitus without complications: Secondary | ICD-10-CM

## 2015-08-10 DIAGNOSIS — Z23 Encounter for immunization: Secondary | ICD-10-CM

## 2015-08-10 DIAGNOSIS — M7660 Achilles tendinitis, unspecified leg: Secondary | ICD-10-CM | POA: Insufficient documentation

## 2015-08-10 LAB — LIPID PANEL
CHOLESTEROL: 140 mg/dL (ref 0–200)
HDL: 34.1 mg/dL — AB (ref >39.00)
LDL CALC: 90 mg/dL (ref 0–99)
NonHDL: 105.63
TRIGLYCERIDES: 79 mg/dL (ref 0.0–149.0)
Total CHOL/HDL Ratio: 4
VLDL: 15.8 mg/dL (ref 0.0–40.0)

## 2015-08-10 LAB — MICROALBUMIN / CREATININE URINE RATIO
Creatinine,U: 111.1 mg/dL
MICROALB/CREAT RATIO: 1 mg/g (ref 0.0–30.0)
Microalb, Ur: 1.1 mg/dL (ref 0.0–1.9)

## 2015-08-10 LAB — COMPREHENSIVE METABOLIC PANEL
ALK PHOS: 65 U/L (ref 39–117)
ALT: 10 U/L (ref 0–35)
AST: 9 U/L (ref 0–37)
Albumin: 3.9 g/dL (ref 3.5–5.2)
BUN: 15 mg/dL (ref 6–23)
CHLORIDE: 104 meq/L (ref 96–112)
CO2: 27 meq/L (ref 19–32)
Calcium: 9.3 mg/dL (ref 8.4–10.5)
Creatinine, Ser: 0.53 mg/dL (ref 0.40–1.20)
GFR: 162.17 mL/min (ref >60.00)
GLUCOSE: 151 mg/dL — AB (ref 70–99)
POTASSIUM: 4 meq/L (ref 3.5–5.1)
SODIUM: 138 meq/L (ref 135–145)
Total Bilirubin: 0.4 mg/dL (ref 0.2–1.2)
Total Protein: 7.6 g/dL (ref 6.0–8.3)

## 2015-08-10 LAB — HEMOGLOBIN A1C: HEMOGLOBIN A1C: 7.7 % — AB (ref 4.6–6.5)

## 2015-08-10 MED ORDER — NAPROXEN-ESOMEPRAZOLE 500-20 MG PO TBEC: 1.0000 | DELAYED_RELEASE_TABLET | Freq: Two times a day (BID) | ORAL | Status: DC | PRN

## 2015-08-10 MED ORDER — DICLOFENAC SODIUM 2 % TD SOLN: 1.0000 "application " | Freq: Two times a day (BID) | TRANSDERMAL | Status: DC | PRN

## 2015-08-10 NOTE — Progress Notes (Signed)
Subjective:    Patient ID: Samantha Ayers, female    DOB: April 07, 1970, 45 y.o.   MRN: [id number]  Chief Complaint  Patient presents with  . Establish Care    knee pain and ankle pain on right pain, fasting just incase    HPI:  Samantha Ayers is a 45 y.o. female who  has a past medical history of Diabetes mellitus; Hypertension; Asthma; Obesity; and Allergy. and presents today for an office visit to establish care.   1.) Knee / Ankle pain - This is a new problem. Associated symptom of pain located in her right knee and ankle has been going on for about 1 month. Knee pain is described as feeling like it is inside the knee. Ankle pain is located on the posterior aspect of her heels around the Achilles. 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 Modifying factors include ibuprofen which does help with the pain. Aggravated with flexion and going up steps. Denies sounds/sensations heard or felt. Course of the symptoms has stayed about the same.   2.) Hypertension - Currently managed with lisinopril-hydrochlorothiazide. Reports taking the medication as prescribed and denies adverse side effects or hypotensive readings. Denies symptoms of end organ damage or worse headache of life. Endorses a low sodium diet  BP Readings from Last 3 Encounters:  08/10/15 118/70  08/17/14 128/64  04/15/13 132/66    3) Type 2 diabetes - type 2 diabetes currently maintained on metformin and Farxiga. Reports taking the medication as prescribed and denies adverse side effects or hypoglycemic readings. Does not currently take her blood sugars at home. Denies symptoms of end organ damage.   Lab Results  Component Value Date   HGBA1C 7.7* 08/10/2015     No Known Allergies   Outpatient Prescriptions Prior to Visit  Medication Sig Dispense Refill  . lisinopril-hydrochlorothiazide (PRINZIDE,ZESTORETIC) 20-12.5 MG per tablet Take 1 tablet by mouth daily.    . metFORMIN  (GLUCOPHAGE) 500 MG tablet Take 500 mg by mouth 2 (two) times daily with a meal.    . albuterol (PROVENTIL) 4 MG tablet Take 4 mg by mouth 2 (two) times daily.    Marland Kitchen aspirin 325 MG tablet Take 325 mg by mouth daily.    Marland Kitchen azelastine (OPTIVAR) 0.05 % ophthalmic solution Place 1 drop into both eyes 2 (two) times daily. 6 mL 12  . cyclobenzaprine (FLEXERIL) 10 MG tablet Take 1 tablet (10 mg total) by mouth 3 (three) times daily as needed for muscle spasms. 30 tablet 0  . diclofenac (VOLTAREN) 75 MG EC tablet Take 1 tablet (75 mg total) by mouth 2 (two) times daily. For 1 week, then as needed 30 tablet 0  . loratadine (CLARITIN) 10 MG tablet Take 10 mg by mouth daily.    . traMADol (ULTRAM) 50 MG tablet Take 1 tablet (50 mg total) by mouth every 6 (six) hours as needed. 15 tablet 0   No facility-administered medications prior to visit.     Past Medical History  Diagnosis Date  . Diabetes mellitus   . Hypertension   . Asthma   . Obesity   . Allergy      History reviewed. No pertinent past surgical history.   Family History  Problem Relation Age of Onset  . Diabetes Mother   . Diabetes Father      Social History   Social History  . Marital Status: Single    Spouse Name: N/A  . Number of Children: 3  .  Years of Education: 16   Occupational History  . Housekeeping    Social History Main Topics  . Smoking status: Never Smoker   . Smokeless tobacco: Never Used  . Alcohol Use: No  . Drug Use: No  . Sexual Activity: Yes    Birth Control/ Protection: IUD   Other Topics Concern  . Not on file   Social History Narrative   Fun: Cooking    Denies abuse and feels safe at home.       Review of Systems  Constitutional: Negative for fever and chills.  Eyes:       Denies changes in vision  Respiratory: Negative for cough, chest tightness and wheezing.   Cardiovascular: Negative for chest pain, palpitations and leg swelling.  Endocrine: Negative for polydipsia, polyphagia  and polyuria.  Neurological: Negative for dizziness, weakness, light-headedness and numbness.      Objective:    BP 118/70 mmHg  Pulse 78  Temp(Src) 98.4 F (36.9 C) (Oral)  Resp 16  Wt 253 lb (114.76 kg)  SpO2 97% Nursing note and vital signs reviewed.  Physical Exam  Constitutional: She is oriented to person, place, and time. She appears well-developed and well-nourished. No distress.  Cardiovascular: Normal rate, regular rhythm, normal heart sounds and intact distal pulses.   Pulmonary/Chest: Effort normal and breath sounds normal.  Neurological: She is alert and oriented to person, place, and time.  Skin: Skin is warm and dry.  Psychiatric: She has a normal mood and affect. Her behavior is normal. Judgment and thought content normal.       Assessment & Plan:   Problem List Items Addressed This Visit      Cardiovascular and Mediastinum   Essential hypertension    Hypertension well-controlled below goal 140/90 with current regimen and no adverse side effects. Continue current dosage of lisinopril-hydrochlorothiazide. No symptoms of end organ damage or worse headache of life.      Relevant Orders   Comprehensive metabolic panel     Endocrine   Type 2 diabetes mellitus (HCC) - Primary    Type 2 diabetes with undetermined status with no recent A1c and does not currently check blood sugars at home. Obtain A1c, urine microalbumin, comprehensive metabolic panel, and lipid profile. Diabetic foot exam completed today. Encouraged to complete diabetic eye exam independently. Maintained on lisinopril for CAD risk reduction. We will address Pneumovax at next office visit. Continue current dosage of metformin and Farxiga pending A1c results.      Relevant Medications   dapagliflozin propanediol (FARXIGA) 10 MG TABS tablet   Other Relevant Orders   Hemoglobin A1c   Comprehensive metabolic panel   Urine Microalbumin w/creat. ratio   Lipid panel     Musculoskeletal and Integument     Achilles tendinitis    Symptoms and exam consistent with Achilles tendinitis most likely related to increased activity and obesity. Range of motion is slightly limited secondary to muscle tightness. Treat conservatively with ice, home exercise therapy, Pennsaid and Vimovo. Follow-up if symptoms worsen or do not improve for ultrasound and possible cortisone injections if necessary.      Relevant Medications   Naproxen-Esomeprazole 500-20 MG TBEC   Diclofenac Sodium (PENNSAID) 2 % SOLN     Other   Right knee pain    Right knee exam is benign and symptoms appear consistent with overuse most likely related to increased work activity as well as obesity. Discussed importance of weight loss. Treat conservatively with Pennsaid, Vimovo, ice and home exercise  therapy. Knee sleeve as needed. If symptoms do not improve consider ultrasound.       Relevant Medications   Naproxen-Esomeprazole 500-20 MG TBEC   Diclofenac Sodium (PENNSAID) 2 % SOLN       I have discontinued Ms. Ayers's loratadine, albuterol, aspirin, traMADol, diclofenac, cyclobenzaprine, and azelastine. I am also having her start on Naproxen-Esomeprazole and Diclofenac Sodium. Additionally, I am having her maintain her lisinopril-hydrochlorothiazide, metFORMIN, and dapagliflozin propanediol.   Meds ordered this encounter  Medications  . dapagliflozin propanediol (FARXIGA) 10 MG TABS tablet    Sig: Take 10 mg by mouth daily.  . Naproxen-Esomeprazole 500-20 MG TBEC    Sig: Take 1 tablet by mouth 2 (two) times daily as needed.    Dispense:  60 tablet    Refill:  0    Order Specific Question:  Supervising Provider    Answer:  Hillard DankerRAWFORD, ELIZABETH A [[id number]]  . Diclofenac Sodium (PENNSAID) 2 % SOLN    Sig: Place 1 application onto the skin 2 (two) times daily as needed.    Dispense:  112 g    Refill:  1    Order Specific Question:  Supervising Provider    Answer:  Hillard DankerRAWFORD, ELIZABETH A [[id number]]     Follow-up: Return in about 3  weeks (around 08/31/2015), or if symptoms worsen or fail to improve.  Jeanine Luzalone, Gregory, FNP

## 2015-08-10 NOTE — Assessment & Plan Note (Signed)
Symptoms and exam consistent with Achilles tendinitis most likely related to increased activity and obesity. Range of motion is slightly limited secondary to muscle tightness. Treat conservatively with ice, home exercise therapy, Pennsaid and Vimovo. Follow-up if symptoms worsen or do not improve for ultrasound and possible cortisone injections if necessary.

## 2015-08-10 NOTE — Patient Instructions (Signed)
Thank you for choosing [facility].  Summary/Instructions:  Please continue to take her medications as prescribed.  Please ice knee and ankles for 20 minutes every 2 hours as needed and especially after work.  Consider a knee sleeve to help with compression  Stretching exercises daily.  They will call for your medications to be sent to you.   If your symptoms worsen or fail to improve, please contact our office for further instruction, or in case of emergency go directly to the emergency room at the closest medical facility.    Generic Knee Exercises EXERCISES RANGE OF MOTION (ROM) AND STRETCHING EXERCISES These exercises may help you when beginning to rehabilitate your injury. Your symptoms may resolve with or without further involvement from your physician, physical therapist, or athletic trainer. While completing these exercises, remember:   Restoring tissue flexibility helps normal motion to return to the joints. This allows healthier, less painful movement and activity.  An effective stretch should be held for at least 30 seconds.  A stretch should never be painful. You should only feel a gentle lengthening or release in the stretched tissue. STRETCH - Knee Extension, Prone  Lie on your stomach on a firm surface, such as a bed or countertop. Place your right / left knee and leg just beyond the edge of the surface. You may wish to place a towel under the far end of your right / left thigh for comfort.  Relax your leg muscles and allow gravity to straighten your knee. Your clinician may advise you to add an ankle weight if more resistance is helpful for you.  You should feel a stretch in the back of your right / left knee. Hold this position for __________ seconds. Repeat __________ times. Complete this stretch __________ times per day. * Your physician, physical therapist, or athletic trainer may ask you to add ankle weight to enhance your stretch.  RANGE OF MOTION -  Knee Flexion, Active  Lie on your back with both knees straight. (If this causes back discomfort, bend your opposite knee, placing your foot flat on the floor.)  Slowly slide your heel back toward your buttocks until you feel a gentle stretch in the front of your knee or thigh.  Hold for __________ seconds. Slowly slide your heel back to the starting position. Repeat __________ times. Complete this exercise __________ times per day.  STRETCH - Quadriceps, Prone   Lie on your stomach on a firm surface, such as a bed or padded floor.  Bend your right / left knee and grasp your ankle. If you are unable to reach your ankle or pant leg, use a belt around your foot to lengthen your reach.  Gently pull your heel toward your buttocks. Your knee should not slide out to the side. You should feel a stretch in the front of your thigh and/or knee.  Hold this position for __________ seconds. Repeat __________ times. Complete this stretch __________ times per day.  STRETCH - Hamstrings, Supine   Lie on your back. Loop a belt or towel over the ball of your right / left foot.  Straighten your right / left knee and slowly pull on the belt to raise your leg. Do not allow the right / left knee to bend. Keep your opposite leg flat on the floor.  Raise the leg until you feel a gentle stretch behind your right / left knee or thigh. Hold this position for __________ seconds. Repeat __________ times. Complete this stretch __________ times  per day.  STRENGTHENING EXERCISES These exercises may help you when beginning to rehabilitate your injury. They may resolve your symptoms with or without further involvement from your physician, physical therapist, or athletic trainer. While completing these exercises, remember:   Muscles can gain both the endurance and the strength needed for everyday activities through controlled exercises.  Complete these exercises as instructed by your physician, physical therapist, or  athletic trainer. Progress the resistance and repetitions only as guided.  You may experience muscle soreness or fatigue, but the pain or discomfort you are trying to eliminate should never worsen during these exercises. If this pain does worsen, stop and make certain you are following the directions exactly. If the pain is still present after adjustments, discontinue the exercise until you can discuss the trouble with your clinician. STRENGTH - Quadriceps, Isometrics  Lie on your back with your right / left leg extended and your opposite knee bent.  Gradually tense the muscles in the front of your right / left thigh. You should see either your knee cap slide up toward your hip or increased dimpling just above the knee. This motion will push the back of the knee down toward the floor/mat/bed on which you are lying.  Hold the muscle as tight as you can without increasing your pain for __________ seconds.  Relax the muscles slowly and completely in between each repetition. Repeat __________ times. Complete this exercise __________ times per day.  STRENGTH - Quadriceps, Short Arcs   Lie on your back. Place a __________ inch towel roll under your knee so that the knee slightly bends.  Raise only your lower leg by tightening the muscles in the front of your thigh. Do not allow your thigh to rise.  Hold this position for __________ seconds. Repeat __________ times. Complete this exercise __________ times per day.  OPTIONAL ANKLE WEIGHTS: Begin with ____________________, but DO NOT exceed ____________________. Increase in 1 pound/0.5 kilogram increments.  STRENGTH - Quadriceps, Straight Leg Raises  Quality counts! Watch for signs that the quadriceps muscle is working to insure you are strengthening the correct muscles and not "cheating" by substituting with healthier muscles.  Lay on your back with your right / left leg extended and your opposite knee bent.  Tense the muscles in the front of your  right / left thigh. You should see either your knee cap slide up or increased dimpling just above the knee. Your thigh may even quiver.  Tighten these muscles even more and raise your leg 4 to 6 inches off the floor. Hold for __________ seconds.  Keeping these muscles tense, lower your leg.  Relax the muscles slowly and completely in between each repetition. Repeat __________ times. Complete this exercise __________ times per day.  STRENGTH - Hamstring, Curls  Lay on your stomach with your legs extended. (If you lay on a bed, your feet may hang over the edge.)  Tighten the muscles in the back of your thigh to bend your right / left knee up to 90 degrees. Keep your hips flat on the bed/floor.  Hold this position for __________ seconds.  Slowly lower your leg back to the starting position. Repeat __________ times. Complete this exercise __________ times per day.  OPTIONAL ANKLE WEIGHTS: Begin with ____________________, but DO NOT exceed ____________________. Increase in 1 pound/0.5 kilogram increments.  STRENGTH - Quadriceps, Squats  Stand in a door frame so that your feet and knees are in line with the frame.  Use your hands for balance, not  support, on the frame.  Slowly lower your weight, bending at the hips and knees. Keep your lower legs upright so that they are parallel with the door frame. Squat only within the range that does not increase your knee pain. Never let your hips drop below your knees.  Slowly return upright, pushing with your legs, not pulling with your hands. Repeat __________ times. Complete this exercise __________ times per day.  STRENGTH - Quadriceps, Wall Slides  Follow guidelines for form closely. Increased knee pain often results from poorly placed feet or knees.  Lean against a smooth wall or door and walk your feet out 18-24 inches. Place your feet hip-width apart.  Slowly slide down the wall or door until your knees bend __________ degrees.* Keep your  knees over your heels, not your toes, and in line with your hips, not falling to either side.  Hold for __________ seconds. Stand up to rest for __________ seconds in between each repetition. Repeat __________ times. Complete this exercise __________ times per day. * Your physician, physical therapist, or athletic trainer will alter this angle based on your symptoms and progress.   This information is not intended to replace advice given to you by your health care provider. Make sure you discuss any questions you have with your health care provider.   Document Released: 12/08/2004 Document Revised: 02/14/2014 Document Reviewed: 05/08/2008  Achilles Tendinitis Achilles tendinitis is inflammation of the tough, cord-like band that attaches the lower muscles of your leg to your heel (Achilles tendon). It is usually caused by overusing the tendon and joint involved.  CAUSES Achilles tendinitis can happen because of:  A sudden increase in exercise or activity (such as running).  Doing the same exercises or activities (such as jumping) over and over.  Not warming up calf muscles before exercising.  Exercising in shoes that are worn out or not made for exercise.  Having arthritis or a bone growth on the back of the heel bone. This can rub against the tendon and hurt the tendon. SIGNS AND SYMPTOMS The most common symptoms are:  Pain in the back of the leg, just above the heel. The pain usually gets worse with exercise and better with rest.  Stiffness or soreness in the back of the leg, especially in the morning.  Swelling of the skin over the Achilles tendon.  Trouble standing on tiptoe. Sometimes, an Achilles tendon tears (ruptures). Symptoms of an Achilles tendon rupture can include:  Sudden, severe pain in the back of the leg.  Trouble putting weight on the foot or walking normally. DIAGNOSIS Achilles tendinitis will be diagnosed based on symptoms and a physical examination. An  X-ray may be done to check if another condition is causing your symptoms. An MRI may be ordered if your health care provider suspects you may have completely torn your tendon, which is called an Achilles tendon rupture.  TREATMENT  Achilles tendinitis usually gets better over time. It can take weeks to months to heal completely. Treatment focuses on treating the symptoms and helping the injury heal. HOME CARE INSTRUCTIONS   Rest your Achilles tendon and avoid activities that cause pain.  Apply ice to the injured area:  Put ice in a plastic bag.  Place a towel between your skin and the bag.  Leave the ice on for 20 minutes, 2-3 times a day  Try to avoid using the tendon (other than gentle range of motion) while the tendon is painful. Do not resume use until instructed  by your health care provider. Then begin use gradually. Do not increase use to the point of pain. If pain does develop, decrease use and continue the above measures. Gradually increase activities that do not cause discomfort until you achieve normal use.  Do exercises to make your calf muscles stronger and more flexible. Your health care provider or physical therapist can recommend exercises for you to do.  Wrap your ankle with an elastic bandage or other wrap. This can help keep your tendon from moving too much. Your health care provider will show you how to wrap your ankle correctly.  Only take over-the-counter or prescription medicines for pain, discomfort, or fever as directed by your health care provider. SEEK MEDICAL CARE IF:   Your pain and swelling increase or pain is uncontrolled with medicines.  You develop new, unexplained symptoms or your symptoms get worse.  You are unable to move your toes or foot.  You develop warmth and swelling in your foot.  You have an unexplained temperature. MAKE SURE YOU:   Understand these instructions.  Will watch your condition.  Will get help right away if you are not  doing well or get worse.   This information is not intended to replace advice given to you by your health care provider. Make sure you discuss any questions you have with your health care provider.   Document Released: 11/03/2004 Document Revised: 02/14/2014 Document Reviewed: 09/05/2012 Elsevier Interactive Patient Education 2016 [employer].  Risk analyst Patient Education [employer]

## 2015-08-10 NOTE — Assessment & Plan Note (Signed)
Hypertension well-controlled below goal 140/90 with current regimen and no adverse side effects. Continue current dosage of lisinopril-hydrochlorothiazide. No symptoms of end organ damage or worse headache of life.

## 2015-08-10 NOTE — Progress Notes (Signed)
Pre visit review using our clinic review tool, if applicable. No additional management support is needed unless otherwise documented below in the visit note. 

## 2015-08-10 NOTE — Assessment & Plan Note (Signed)
Right knee exam is benign and symptoms appear consistent with overuse most likely related to increased work activity as well as obesity. Discussed importance of weight loss. Treat conservatively with Pennsaid, Vimovo, ice and home exercise therapy. Knee sleeve as needed. If symptoms do not improve consider ultrasound.

## 2015-08-10 NOTE — Assessment & Plan Note (Addendum)
Type 2 diabetes with undetermined status with no recent A1c and does not currently check blood sugars at home. Obtain A1c, urine microalbumin, comprehensive metabolic panel, and lipid profile. Diabetic foot exam completed today. Encouraged to complete diabetic eye exam independently. Maintained on lisinopril for CAD risk reduction. We will address Pneumovax at next office visit. Continue current dosage of metformin and Farxiga pending A1c results.

## 2015-08-12 MED ORDER — METFORMIN HCL 1000 MG PO TABS: 1000.0000 mg | ORAL_TABLET | Freq: Two times a day (BID) | ORAL | Status: DC

## 2015-08-12 MED ORDER — ROSUVASTATIN CALCIUM 20 MG PO TABS: 20.0000 mg | ORAL_TABLET | Freq: Every day | ORAL | Status: DC

## 2015-08-13 ENCOUNTER — Telehealth: Payer: Self-pay | Admitting: Family

## 2015-08-13 NOTE — Telephone Encounter (Signed)
Pt stated that Tammy SoursGreg gave her sample of Vimovo but this med making her feel nausea. Please advise

## 2015-08-14 NOTE — Telephone Encounter (Signed)
Please advise 

## 2015-08-18 NOTE — Telephone Encounter (Signed)
Stop Vimovo.  We can send in 800 mg of ibuprofen or meloxicam.

## 2015-08-19 MED ORDER — MELOXICAM 15 MG PO TABS: 15.0000 mg | ORAL_TABLET | Freq: Every day | ORAL | Status: DC

## 2015-08-19 NOTE — Telephone Encounter (Signed)
Medication sent to pharmacy  

## 2015-08-19 NOTE — Telephone Encounter (Signed)
Pt is interested in trying meloxicam. Please send to [facility] at [place].

## 2015-09-29 ENCOUNTER — Other Ambulatory Visit: Payer: Self-pay | Admitting: Family

## 2015-11-04 ENCOUNTER — Other Ambulatory Visit: Payer: Self-pay

## 2015-11-04 MED ORDER — METFORMIN HCL 1000 MG PO TABS: 1000.0000 mg | ORAL_TABLET | Freq: Two times a day (BID) | ORAL | 0 refills | Status: DC

## 2015-12-08 ENCOUNTER — Other Ambulatory Visit: Payer: BLUE CROSS/BLUE SHIELD

## 2015-12-08 ENCOUNTER — Encounter: Payer: Self-pay | Admitting: Family

## 2015-12-08 ENCOUNTER — Ambulatory Visit (INDEPENDENT_AMBULATORY_CARE_PROVIDER_SITE_OTHER): Payer: BLUE CROSS/BLUE SHIELD | Admitting: Family

## 2015-12-08 VITALS — BP 140/80 | HR 89 | Temp 99.0°F | Resp 16 | Ht 68.0 in | Wt 255.8 lb

## 2015-12-08 DIAGNOSIS — L298 Other pruritus: Secondary | ICD-10-CM | POA: Diagnosis not present

## 2015-12-08 DIAGNOSIS — N898 Other specified noninflammatory disorders of vagina: Secondary | ICD-10-CM

## 2015-12-08 DIAGNOSIS — I10 Essential (primary) hypertension: Secondary | ICD-10-CM

## 2015-12-08 DIAGNOSIS — E119 Type 2 diabetes mellitus without complications: Secondary | ICD-10-CM | POA: Diagnosis not present

## 2015-12-08 LAB — WET PREP, GENITAL
CLUE CELLS WET PREP: NONE SEEN — AB
Trich, Wet Prep: NONE SEEN — AB
Yeast Wet Prep HPF POC: NONE SEEN — AB

## 2015-12-08 MED ORDER — DAPAGLIFLOZIN PROPANEDIOL 10 MG PO TABS: 10.0000 mg | ORAL_TABLET | Freq: Every day | ORAL | 0 refills | Status: DC

## 2015-12-08 MED ORDER — METFORMIN HCL 1000 MG PO TABS: 1000.0000 mg | ORAL_TABLET | Freq: Two times a day (BID) | ORAL | 1 refills | Status: DC

## 2015-12-08 MED ORDER — LISINOPRIL-HYDROCHLOROTHIAZIDE 20-12.5 MG PO TABS: 1.0000 | ORAL_TABLET | Freq: Every day | ORAL | 1 refills | Status: DC

## 2015-12-08 MED ORDER — FLUCONAZOLE 150 MG PO TABS: 150.0000 mg | ORAL_TABLET | Freq: Once | ORAL | 0 refills | Status: AC

## 2015-12-08 NOTE — Patient Instructions (Signed)
Thank you for choosing [facility].  SUMMARY AND INSTRUCTIONS:  We will call with the results of your lab work.   Medication:  Please start the fluconazole.   Your prescription(s) have been submitted to your pharmacy or been printed and provided for you. Please take as directed and contact our office if you believe you are having problem(s) with the medication(s) or have any questions.  Labs:  Please stop by the lab on the lower level of the building for your blood work. Your results will be released to MyChart (or called to you) after review, usually within 72 hours after test completion. If any changes need to be made, you will be notified at that same time.  1.) The lab is open from 7:30am to 5:30 pm Monday-Friday 2.) No appointment is necessary 3.) Fasting (if needed) is 6-8 hours after food and drink; black coffee and water are okay   Follow up:  If your symptoms worsen or fail to improve, please contact our office for further instruction, or in case of emergency go directly to the emergency room at the closest medical facility.

## 2015-12-08 NOTE — Progress Notes (Signed)
Subjective:    Patient ID: Samantha Ayers, female    DOB: 11/25/1970, 45 y.o.   MRN: [id number]  Chief Complaint  Patient presents with  . Medication Refill    yeast infection due to sugars, having vaginal itching on the outside, x2 weeks    HPI:  Samantha Ayers is a 45 y.o. female who  has a past medical history of Allergy; Asthma; Diabetes mellitus; Hypertension; and Obesity. and presents today for an acute office visit.  This is a new problem. Associated symptom of vaginal itching primarily around the vulva has been going on for about 2 weeks. Denies vaginal discharge, urinary frequency, urinary urgency, dysuria or fevers. Modifying factors include benedryl which has not helped very much. Course of the symptoms have stayed about the same. Samantha Ayers does have diabetes.    No Known Allergies    Outpatient Medications Prior to Visit  Medication Sig Dispense Refill  . Diclofenac Sodium (PENNSAID) 2 % SOLN Place 1 application onto the skin 2 (two) times daily as needed. 112 g 1  . meloxicam (MOBIC) 15 MG tablet Take 1 tablet (15 mg total) by mouth daily. 30 tablet 0  . Naproxen-Esomeprazole 500-20 MG TBEC Take 1 tablet by mouth 2 (two) times daily as needed. 60 tablet 0  . rosuvastatin (CRESTOR) 20 MG tablet Take 1 tablet (20 mg total) by mouth daily. 30 tablet 2  . dapagliflozin propanediol (FARXIGA) 10 MG TABS tablet Take 10 mg by mouth daily.    Marland Kitchen. lisinopril-hydrochlorothiazide (PRINZIDE,ZESTORETIC) 20-12.5 MG per tablet Take 1 tablet by mouth daily.    . metFORMIN (GLUCOPHAGE) 1000 MG tablet Take 1 tablet (1,000 mg total) by mouth 2 (two) times daily with a meal. 60 tablet 0   No facility-administered medications prior to visit.      Review of Systems  Constitutional: Negative for chills and fever.  Genitourinary: Negative for flank pain, frequency, hematuria, urgency, vaginal bleeding, vaginal discharge and vaginal pain.      Objective:    BP 140/80 (BP Location: Left Arm,  Patient Position: Sitting, Cuff Size: Large)   Pulse 89   Temp 99 F (37.2 C) (Oral)   Resp 16   Ht 5\' 8"  (1.727 m)   Wt 255 lb 12.8 oz (116 kg)   SpO2 98%   BMI 38.89 kg/m  Nursing note and vital signs reviewed.  Physical Exam  Constitutional: Samantha Ayers is oriented to person, place, and time. Samantha Ayers appears well-developed and well-nourished. No distress.  Cardiovascular: Normal rate, regular rhythm, normal heart sounds and intact distal pulses.   Pulmonary/Chest: Effort normal and breath sounds normal.  Genitourinary:  Genitourinary Comments: Declines GU exam  Neurological: Samantha Ayers is alert and oriented to person, place, and time.  Skin: Skin is warm and dry.  Psychiatric: Samantha Ayers has a normal mood and affect. Her behavior is normal. Judgment and thought content normal.       Assessment & Plan:   Problem List Items Addressed This Visit      Cardiovascular and Mediastinum   Essential hypertension   Relevant Medications   lisinopril-hydrochlorothiazide (PRINZIDE,ZESTORETIC) 20-12.5 MG tablet     Endocrine   Type 2 diabetes mellitus (HCC)   Relevant Medications   dapagliflozin propanediol (FARXIGA) 10 MG TABS tablet   lisinopril-hydrochlorothiazide (PRINZIDE,ZESTORETIC) 20-12.5 MG tablet   metFORMIN (GLUCOPHAGE) 1000 MG tablet     Musculoskeletal and Integument   Vaginal itching - Primary    Vaginal itching symptoms consistent with possible yeast infection with wet prep obtained.  Start fluconazole. Follow-up pending wet prep results or if symptoms worsen or do not improve.      Relevant Medications   fluconazole (DIFLUCAN) 150 MG tablet   Other Relevant Orders   Wet prep, genital (Completed)    Other Visit Diagnoses   None.      I have changed Samantha Ayers's lisinopril-hydrochlorothiazide. I am also having her start on fluconazole. Additionally, I am having her maintain her Naproxen-Esomeprazole, Diclofenac Sodium, rosuvastatin, meloxicam, dapagliflozin propanediol, and  metFORMIN.   Meds ordered this encounter  Medications  . dapagliflozin propanediol (FARXIGA) 10 MG TABS tablet    Sig: Take 10 mg by mouth daily.    Dispense:  30 tablet    Refill:  0  . lisinopril-hydrochlorothiazide (PRINZIDE,ZESTORETIC) 20-12.5 MG tablet    Sig: Take 1 tablet by mouth daily.    Dispense:  90 tablet    Refill:  1  . metFORMIN (GLUCOPHAGE) 1000 MG tablet    Sig: Take 1 tablet (1,000 mg total) by mouth 2 (two) times daily with a meal.    Dispense:  180 tablet    Refill:  1  . fluconazole (DIFLUCAN) 150 MG tablet    Sig: Take 1 tablet (150 mg total) by mouth once. May repeat in 72 hours as needed.    Dispense:  2 tablet    Refill:  0    Order Specific Question:   Supervising Provider    Answer:   Hillard DankerRAWFORD, ELIZABETH A [[id number]]     Follow-up: Return if symptoms worsen or fail to improve.  Jeanine Luzalone, Kycen Spalla, FNP

## 2015-12-08 NOTE — Assessment & Plan Note (Signed)
Vaginal itching symptoms consistent with possible yeast infection with wet prep obtained. Start fluconazole. Follow-up pending wet prep results or if symptoms worsen or do not improve.

## 2015-12-25 ENCOUNTER — Other Ambulatory Visit (INDEPENDENT_AMBULATORY_CARE_PROVIDER_SITE_OTHER): Payer: BLUE CROSS/BLUE SHIELD

## 2015-12-25 ENCOUNTER — Encounter: Payer: Self-pay | Admitting: Family

## 2015-12-25 ENCOUNTER — Ambulatory Visit (INDEPENDENT_AMBULATORY_CARE_PROVIDER_SITE_OTHER): Payer: BLUE CROSS/BLUE SHIELD | Admitting: Family

## 2015-12-25 VITALS — BP 164/82 | HR 92 | Temp 98.4°F | Resp 16 | Ht 68.0 in | Wt 257.0 lb

## 2015-12-25 DIAGNOSIS — R0789 Other chest pain: Secondary | ICD-10-CM

## 2015-12-25 DIAGNOSIS — E119 Type 2 diabetes mellitus without complications: Secondary | ICD-10-CM | POA: Diagnosis not present

## 2015-12-25 DIAGNOSIS — R52 Pain, unspecified: Secondary | ICD-10-CM

## 2015-12-25 DIAGNOSIS — R519 Headache, unspecified: Secondary | ICD-10-CM | POA: Insufficient documentation

## 2015-12-25 DIAGNOSIS — R51 Headache: Secondary | ICD-10-CM

## 2015-12-25 LAB — TROPONIN I: TNIDX: 0.01 ug/L (ref 0.00–0.06)

## 2015-12-25 LAB — BASIC METABOLIC PANEL
BUN: 14 mg/dL (ref 6–23)
CALCIUM: 9 mg/dL (ref 8.4–10.5)
CHLORIDE: 102 meq/L (ref 96–112)
CO2: 29 mEq/L (ref 19–32)
CREATININE: 0.66 mg/dL (ref 0.40–1.20)
GFR: 125.68 mL/min (ref >60.00)
Glucose, Bld: 213 mg/dL — ABNORMAL HIGH (ref 70–99)
Potassium: 3.6 mEq/L (ref 3.5–5.1)
Sodium: 138 mEq/L (ref 135–145)

## 2015-12-25 MED ORDER — IBUPROFEN 800 MG PO TABS: 800.0000 mg | ORAL_TABLET | Freq: Three times a day (TID) | ORAL | 0 refills | Status: DC | PRN

## 2015-12-25 MED ORDER — ONETOUCH SURESOFT LANCING DEV MISC: 1 refills | Status: AC

## 2015-12-25 MED ORDER — ONETOUCH ULTRA MINI W/DEVICE KIT: PACK | 0 refills | Status: DC

## 2015-12-25 MED ORDER — GLUCOSE BLOOD VI STRP: ORAL_STRIP | 12 refills | Status: DC
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
## 2015-12-25 NOTE — Assessment & Plan Note (Signed)
Generalized headaches with concern for possible mild concussion with normal neurological exam. Continue conservative treatment and continue to monitor. Educated regarding signs of intracranial hemorrhage in one to seek further care. Continue to monitor.
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
## 2015-12-25 NOTE — Patient Instructions (Addendum)
Thank you for choosing [facility].  SUMMARY AND INSTRUCTIONS:  Ice and moist heat x 20 minutes every 2 hours as needed for discomfort for your knee and shoulders.   Tylenol and ibpuprofen as needed.   Stretches and exercise daily.   Medication:  Your prescription(s) have been submitted to your pharmacy or been printed and provided for you. Please take as directed and contact our office if you believe you are having problem(s) with the medication(s) or have any questions.  Labs:  Please stop by the lab on the lower level of the building for your blood work. Your results will be released to MyChart (or called to you) after review, usually within 72 hours after test completion. If any changes need to be made, you will be notified at that same time.  1.) The lab is open from 7:30am to 5:30 pm Monday-Friday 2.) No appointment is necessary 3.) Fasting (if needed) is 6-8 hours after food and drink; black coffee and water are okay   Follow up:  If your symptoms worsen or fail to improve, please contact our office for further instruction, or in case of emergency go directly to the emergency room at the closest medical facility.

## 2016-04-04 ENCOUNTER — Other Ambulatory Visit: Payer: Self-pay | Admitting: Family

## 2016-04-04 DIAGNOSIS — E119 Type 2 diabetes mellitus without complications: Secondary | ICD-10-CM

## 2016-06-06 ENCOUNTER — Ambulatory Visit (INDEPENDENT_AMBULATORY_CARE_PROVIDER_SITE_OTHER): Payer: BC Managed Care – PPO | Admitting: Internal Medicine

## 2016-06-06 ENCOUNTER — Encounter: Payer: Self-pay | Admitting: Internal Medicine

## 2016-06-06 VITALS — BP 120/70 | HR 99 | Temp 98.5°F | Resp 16 | Ht 68.0 in | Wt 260.0 lb

## 2016-06-06 DIAGNOSIS — R0789 Other chest pain: Secondary | ICD-10-CM

## 2016-06-06 DIAGNOSIS — J301 Allergic rhinitis due to pollen: Secondary | ICD-10-CM

## 2016-06-06 MED ORDER — LEVOCETIRIZINE DIHYDROCHLORIDE 5 MG PO TABS: 5.0000 mg | ORAL_TABLET | Freq: Every evening | ORAL | 3 refills | Status: DC

## 2016-06-06 MED ORDER — IBUPROFEN 800 MG PO TABS: 800.0000 mg | ORAL_TABLET | Freq: Three times a day (TID) | ORAL | 0 refills | Status: DC | PRN

## 2016-06-06 MED ORDER — TRIAMCINOLONE ACETONIDE 55 MCG/ACT NA AERO: 2.0000 | INHALATION_SPRAY | Freq: Every day | NASAL | 3 refills | Status: DC

## 2016-06-06 NOTE — Progress Notes (Signed)
Subjective:  Patient ID: Samantha Ayers, female    DOB: Jul 22, 1970  Age: 46 y.o. MRN: [id number]  CC: Chest Pain and Allergic Rhinitis    HPI Samantha Ayers presents for concerns about a 2 day history of left anterior chest wall pain. She says the pain occurred after she slept 1 night in a chair. She says the pain is exacerbated by movement and palpation. She also has aching in her back. She took a couple of doses of ibuprofen one day prior to this and got pain relief but came in today because she is out of ibuprofen. She also complains of nasal allergies.  Outpatient Medications Prior to Visit  Medication Sig Dispense Refill  . Blood Glucose Monitoring Suppl (ONE TOUCH ULTRA MINI) w/Device KIT Use meter to check blood sugars 1-4 times daily as instructed. 1 each 0  . glucose blood (ONE TOUCH ULTRA TEST) test strip Use one strip per test. Test blood sugars 1-4 times daily as instructed. 100 each 12  . Lancets Misc. (ONE TOUCH SURESOFT) MISC Use 1 lancet per test. Test blood sugars 1-4 times per day as instructed. 1 each 1  . lisinopril-hydrochlorothiazide (PRINZIDE,ZESTORETIC) 20-12.5 MG tablet Take 1 tablet by mouth daily. 90 tablet 1  . metFORMIN (GLUCOPHAGE) 1000 MG tablet TAKE ONE TABLET BY MOUTH TWICE DAILY WITH  A  MEAL 180 tablet 1  . rosuvastatin (CRESTOR) 20 MG tablet Take 1 tablet (20 mg total) by mouth daily. 30 tablet 2  . Diclofenac Sodium (PENNSAID) 2 % SOLN Place 1 application onto the skin 2 (two) times daily as needed. 112 g 1  . ibuprofen (ADVIL,MOTRIN) 800 MG tablet Take 1 tablet (800 mg total) by mouth every 8 (eight) hours as needed. 90 tablet 0  . meloxicam (MOBIC) 15 MG tablet Take 1 tablet (15 mg total) by mouth daily. 30 tablet 0  . Naproxen-Esomeprazole 500-20 MG TBEC Take 1 tablet by mouth 2 (two) times daily as needed. 60 tablet 0   No facility-administered medications prior to visit.     ROS Review of Systems  Constitutional: Negative for chills,  diaphoresis, fatigue and fever.  HENT: Positive for congestion, postnasal drip and rhinorrhea. Negative for facial swelling, nosebleeds, sinus pain, sinus pressure, sneezing, sore throat, tinnitus and trouble swallowing.   Eyes: Negative.   Respiratory: Negative.  Negative for cough, chest tightness, shortness of breath and wheezing.   Cardiovascular: Positive for chest pain. Negative for palpitations and leg swelling.  Gastrointestinal: Negative for abdominal pain, constipation, diarrhea, nausea and vomiting.  Genitourinary: Negative.   Musculoskeletal: Positive for back pain. Negative for arthralgias, myalgias and neck pain.  Skin: Negative.  Negative for color change and rash.  Allergic/Immunologic: Negative.   Neurological: Negative.   Hematological: Negative for adenopathy. Does not bruise/bleed easily.  Psychiatric/Behavioral: Negative.     Objective:  BP 120/70 (BP Location: Left Arm, Patient Position: Sitting, Cuff Size: Large)   Pulse 99   Temp 98.5 F (36.9 C) (Oral)   Resp 16   Ht 5' 8"  (1.727 m)   Wt 260 lb (117.9 kg)   SpO2 99%   BMI 39.53 kg/m   BP Readings from Last 3 Encounters:  06/06/16 120/70  12/25/15 (!) 164/82  12/08/15 140/80    Wt Readings from Last 3 Encounters:  06/06/16 260 lb (117.9 kg)  12/25/15 257 lb (116.6 kg)  12/08/15 255 lb 12.8 oz (116 kg)    Physical Exam  Constitutional:  Non-toxic appearance. She does not have a  sickly appearance. She does not appear ill. No distress.  HENT:  Nose: Mucosal edema and rhinorrhea present. No sinus tenderness. No epistaxis. Right sinus exhibits no maxillary sinus tenderness and no frontal sinus tenderness. Left sinus exhibits no maxillary sinus tenderness and no frontal sinus tenderness.  Mouth/Throat: Oropharynx is clear and moist. No oropharyngeal exudate.  Eyes: Conjunctivae are normal. Right eye exhibits no discharge. Left eye exhibits no discharge. No scleral icterus.  Neck: Normal range of motion.  Neck supple. No JVD present. No tracheal deviation present. No thyromegaly present.  Cardiovascular: Normal rate, regular rhythm, normal heart sounds and intact distal pulses.  Exam reveals no gallop and no friction rub.   No murmur heard. EKG -  Sinus  Rhythm  WITHIN NORMAL LIMITS  No ischemia No tachycardia or [id number] to suggest PE  Pulmonary/Chest: Effort normal and breath sounds normal. No accessory muscle usage or stridor. No tachypnea. No respiratory distress. She has no decreased breath sounds. She has no wheezes. She has no rhonchi. She has no rales. She exhibits no tenderness.    Abdominal: Soft. Bowel sounds are normal. She exhibits no distension. There is no tenderness. There is no rebound and no guarding.  Lymphadenopathy:    She has no cervical adenopathy.  Skin: She is not diaphoretic.  Vitals reviewed.   Lab Results  Component Value Date   WBC 13.9 (H) 02/20/2008   HGB 11.9 (L) 02/12/2010   HCT 35.0 (L) 02/12/2010   PLT 200 02/20/2008   GLUCOSE 213 (H) 12/25/2015   CHOL 140 08/10/2015   TRIG 79.0 08/10/2015   HDL 34.10 (L) 08/10/2015   LDLCALC 90 08/10/2015   ALT 10 08/10/2015   AST 9 08/10/2015   NA 138 12/25/2015   K 3.6 12/25/2015   CL 102 12/25/2015   CREATININE 0.66 12/25/2015   BUN 14 12/25/2015   CO2 29 12/25/2015   HGBA1C 7.7 (H) 08/10/2015   MICROALBUR 1.1 08/10/2015    No results found.  Assessment & Plan:   Samantha Ayers was seen today for chest pain and allergic rhinitis .  Diagnoses and all orders for this visit:  Atypical chest pain- her pain is consistent with musculoskeletal causes and her EKG shows no signs of ischemia or tachycardia, will continue to treat with ibuprofen. -     EKG 12-Lead -     ibuprofen (ADVIL,MOTRIN) 800 MG tablet; Take 1 tablet (800 mg total) by mouth every 8 (eight) hours as needed.  Seasonal allergic rhinitis due to pollen -     triamcinolone (NASACORT AQ) 55 MCG/ACT AERO nasal inhaler; Place 2 sprays into the  nose daily. -     levocetirizine (XYZAL) 5 MG tablet; Take 1 tablet (5 mg total) by mouth every evening.   I have discontinued Ms. Ayers's Naproxen-Esomeprazole, Diclofenac Sodium, and meloxicam. I am also having her start on triamcinolone and levocetirizine. Additionally, I am having her maintain her rosuvastatin, lisinopril-hydrochlorothiazide, glucose blood, ONE TOUCH SURESOFT, ONE TOUCH ULTRA MINI, metFORMIN, and ibuprofen.  Meds ordered this encounter  Medications  . ibuprofen (ADVIL,MOTRIN) 800 MG tablet    Sig: Take 1 tablet (800 mg total) by mouth every 8 (eight) hours as needed.    Dispense:  45 tablet    Refill:  0  . triamcinolone (NASACORT AQ) 55 MCG/ACT AERO nasal inhaler    Sig: Place 2 sprays into the nose daily.    Dispense:  32.4 mL    Refill:  3  . levocetirizine (XYZAL) 5 MG tablet  Sig: Take 1 tablet (5 mg total) by mouth every evening.    Dispense:  90 tablet    Refill:  3     Follow-up: Return in about 3 weeks (around 06/27/2016).  Scarlette Calico, MD

## 2016-06-06 NOTE — Patient Instructions (Signed)
Chest Wall Pain °Chest wall pain is pain in or around the bones and muscles of your chest. Sometimes, an injury causes this pain. Sometimes, the cause may not be known. This pain may take several weeks or longer to get better. °Follow these instructions at home: °Pay attention to any changes in your symptoms. Take these actions to help with your pain: °· Rest as told by your health care provider. °· Avoid activities that cause pain. These include any activities that use your chest muscles or your abdominal and side muscles to lift heavy items. °· If directed, apply ice to the painful area: °¨ Put ice in a plastic bag. °¨ Place a towel between your skin and the bag. °¨ Leave the ice on for 20 minutes, 2-3 times per day. °· Take over-the-counter and prescription medicines only as told by your health care provider. °· Do not use tobacco products, including cigarettes, chewing tobacco, and e-cigarettes. If you need help quitting, ask your health care provider. °· Keep all follow-up visits as told by your health care provider. This is important. °Contact a health care provider if: °· You have a fever. °· Your chest pain becomes worse. °· You have new symptoms. °Get help right away if: °· You have nausea or vomiting. °· You feel sweaty or light-headed. °· You have a cough with phlegm (sputum) or you cough up blood. °· You develop shortness of breath. °This information is not intended to replace advice given to you by your health care provider. Make sure you discuss any questions you have with your health care provider. °Document Released: 01/24/2005 Document Revised: 06/04/2015 Document Reviewed: 04/21/2014 °Elsevier Interactive Patient Education © 2017 [employer] ° °

## 2016-06-06 NOTE — Progress Notes (Signed)
Pre visit review using our clinic review tool, if applicable. No additional management support is needed unless otherwise documented below in the visit note. 

## 2016-06-24 ENCOUNTER — Other Ambulatory Visit: Payer: Self-pay | Admitting: Family

## 2016-06-24 DIAGNOSIS — I10 Essential (primary) hypertension: Secondary | ICD-10-CM

## 2016-08-25 ENCOUNTER — Ambulatory Visit: Payer: BC Managed Care – PPO | Admitting: Family

## 2016-09-22 ENCOUNTER — Ambulatory Visit (INDEPENDENT_AMBULATORY_CARE_PROVIDER_SITE_OTHER): Payer: BC Managed Care – PPO | Admitting: Internal Medicine

## 2016-09-22 ENCOUNTER — Encounter: Payer: Self-pay | Admitting: Internal Medicine

## 2016-09-22 VITALS — BP 128/84 | HR 106 | Ht 68.0 in | Wt 265.0 lb

## 2016-09-22 DIAGNOSIS — M7661 Achilles tendinitis, right leg: Secondary | ICD-10-CM | POA: Diagnosis not present

## 2016-09-22 DIAGNOSIS — I10 Essential (primary) hypertension: Secondary | ICD-10-CM | POA: Diagnosis not present

## 2016-09-22 DIAGNOSIS — M722 Plantar fascial fibromatosis: Secondary | ICD-10-CM | POA: Diagnosis not present

## 2016-09-22 MED ORDER — METHYLPREDNISOLONE ACETATE 80 MG/ML IJ SUSP
80.0000 mg | Freq: Once | INTRAMUSCULAR | Status: AC
  Administered 2016-09-22: 80 mg via INTRAMUSCULAR

## 2016-09-22 MED ORDER — TRAMADOL HCL 50 MG PO TABS: 50.0000 mg | ORAL_TABLET | Freq: Four times a day (QID) | ORAL | 0 refills | Status: DC | PRN

## 2016-09-22 MED ORDER — PREDNISONE 10 MG PO TABS: ORAL_TABLET | ORAL | 0 refills | Status: DC

## 2016-09-22 NOTE — Patient Instructions (Addendum)
You had the steroid shot today  Please take all new medication as prescribed - the prednisone, and pain medication  Please avoid walking too much for exercise   Please make appt with Dr Jordan LikesSchmitz for early next week in follow up and further treatment  Please continue all other medications as before, and refills have been done if requested.  Please have the pharmacy call with any other refills you may need.  Please keep your appointments with your specialists as you may have planned

## 2016-09-22 NOTE — Assessment & Plan Note (Signed)
Mild to mod, for tx as above, also shoe inserts and/or heel cushions and soft soled shoes

## 2016-09-22 NOTE — Progress Notes (Signed)
Subjective:    Patient ID: Samantha Ayers, female    DOB: April 29, 1970, 46 y.o.   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  Has not gained wt rcently but not lost wt as well.  Has been trying to walk with her family members after work recently for weight loss.   Pt denies fever, wt loss, night sweats, loss of appetite, or other constitutional symptoms. Pain is both in the back of the heels bilat, as well as the plantar heels as well. Past Medical History:  Diagnosis Date  . Allergy   . Asthma   . Diabetes mellitus   . Hypertension   . Obesity    No past surgical history on file.  reports that she has never smoked. She has never used smokeless tobacco. She reports that she does not drink alcohol or use drugs. family history includes Diabetes in her father and mother. No Known Allergies Current Outpatient Prescriptions on File Prior to Visit  Medication Sig Dispense Refill  . Blood Glucose Monitoring Suppl (ONE TOUCH ULTRA MINI) w/Device KIT Use meter to check blood sugars 1-4 times daily as instructed. 1 each 0  . glucose blood (ONE TOUCH ULTRA TEST) test strip Use one strip per test. Test blood sugars 1-4 times daily as instructed. 100 each 12  . ibuprofen (ADVIL,MOTRIN) 800 MG tablet Take 1 tablet (800 mg total) by mouth every 8 (eight) hours as needed. 45 tablet 0  . Lancets Misc. (ONE TOUCH SURESOFT) MISC Use 1 lancet per test. Test blood sugars 1-4 times per day as instructed. 1 each 1  . levocetirizine (XYZAL) 5 MG tablet Take 1 tablet (5 mg total) by mouth every evening. 90 tablet 3  . lisinopril-hydrochlorothiazide (PRINZIDE,ZESTORETIC) 20-12.5 MG tablet TAKE ONE TABLET BY MOUTH ONCE DAILY 90 tablet 1  . metFORMIN (GLUCOPHAGE) 1000 MG tablet TAKE  ONE TABLET BY MOUTH TWICE DAILY WITH  A  MEAL 180 tablet 1  . rosuvastatin (CRESTOR) 20 MG tablet Take 1 tablet (20 mg total) by mouth daily. 30 tablet 2  . triamcinolone (NASACORT AQ) 55 MCG/ACT AERO nasal inhaler Place 2 sprays into the nose daily. 32.4 mL 3   No current facility-administered medications on file prior to visit.    Review of Systems  Constitutional: Negative for other unusual diaphoresis or sweats HENT: Negative for ear discharge or swelling Eyes: Negative for other worsening visual disturbances Respiratory: Negative for stridor or other swelling  Gastrointestinal: Negative for worsening distension or other blood Genitourinary: Negative for retention or other urinary change Musculoskeletal: Negative for other MSK pain or swelling Skin: Negative for color change or other new lesions Neurological: Negative for worsening tremors and other numbness  Psychiatric/Behavioral: Negative for worsening agitation or other fatigue All other system neg per pt    Objective:   Physical Exam BP 128/84   Pulse (!) 106   Ht 5' 8"  (1.727 m)   Wt 265 lb (120.2 kg)   SpO2 100%   BMI 40.29 kg/m  VS noted,  Constitutional: Pt appears in NAD HENT: Head: NCAT.  Right Ear: External ear normal.  Left Ear: External ear normal.  Eyes: . Pupils are equal, round, and reactive to light. Conjunctivae and EOM are normal Nose: without  d/c or deformity Neck: Neck supple. Gross normal ROM Cardiovascular: Normal rate and regular rhythm.   Pulmonary/Chest: Effort normal and breath sounds without rales or wheezing.  Mod tender red and mild swelling bilat achilles insertion sites, as well as tender with some callosity to the plant heels Neurological: Pt is alert. At baseline orientation, motor grossly intact Skin: Skin is warm. No rashes, other new lesions, no LE edema Psychiatric: Pt behavior is normal without agitation  No other exam findings      Assessment & Plan:

## 2016-09-22 NOTE — Assessment & Plan Note (Signed)
stable overall by history and exam, recent data reviewed with pt, and pt to continue medical treatment as before,  to f/u any worsening symptoms or concerns BP Readings from Last 3 Encounters:  09/22/16 128/84  06/06/16 120/70  12/25/15 (!) 164/82

## 2016-09-22 NOTE — Assessment & Plan Note (Signed)
Bilat, mod to severe, for depomedrol IM 80, tramadol prn, avoid walking for exercise for now, prednisone asd, and f/u with sport medicine if not improved in 3-5 days

## 2016-09-23 ENCOUNTER — Ambulatory Visit: Payer: BC Managed Care – PPO | Admitting: Family

## 2016-09-27 ENCOUNTER — Encounter: Payer: Self-pay | Admitting: Family Medicine

## 2016-09-27 ENCOUNTER — Ambulatory Visit (INDEPENDENT_AMBULATORY_CARE_PROVIDER_SITE_OTHER): Payer: BC Managed Care – PPO | Admitting: Family Medicine

## 2016-09-27 VITALS — BP 118/80 | HR 93 | Temp 98.3°F | Ht 68.0 in | Wt 258.0 lb

## 2016-09-27 DIAGNOSIS — M7661 Achilles tendinitis, right leg: Secondary | ICD-10-CM

## 2016-09-27 DIAGNOSIS — M722 Plantar fascial fibromatosis: Secondary | ICD-10-CM | POA: Diagnosis not present

## 2016-09-27 DIAGNOSIS — M7662 Achilles tendinitis, left leg: Secondary | ICD-10-CM | POA: Diagnosis not present

## 2016-09-27 DIAGNOSIS — E669 Obesity, unspecified: Secondary | ICD-10-CM | POA: Diagnosis not present

## 2016-09-27 MED ORDER — DICLOFENAC SODIUM 2 % TD SOLN: 1.0000 "application " | Freq: Two times a day (BID) | TRANSDERMAL | 1 refills | Status: DC

## 2016-09-27 NOTE — Patient Instructions (Signed)
Thank you for coming in,   Please do not take the prednisone anymore. Please try to obtain the heel lifts and the midfoot arch straps. I am also sending a medication that he can rub on the areas that hurt. Please follow-up with me in 3-4 weeks. If you do not have any improvement with your pain we can consider formal physical therapy or injection therapy. Please try to stretch her calves every morning.   Please feel free to call with any questions or concerns at any time, at [phone]. --Dr. Jordan Likes

## 2016-09-27 NOTE — Assessment & Plan Note (Signed)
Unclear if she has plantar fasciitis based on exam.  - Try midfoot arch strap - Provided with home exercises - If no improvement Consider injection

## 2016-09-27 NOTE — Assessment & Plan Note (Signed)
Likely contributing to her symptoms. - Advised & counseled on weight loss.

## 2016-09-27 NOTE — Assessment & Plan Note (Addendum)
It appears that she has an Achilles Enthesopathy but does not appear to have any systemic symptoms. Possible with some retrocalcaneal bursitis. Does not appear to be a tendinopathy. Advised that she doesn't have to take the prednisone prescribed for her. - Advised to obtain heel lifts. - Provided Pennsaid  - Provided home exercises - If no improvement can consider a retrocalcaneal bursa injection. Could consider advising orthotics. Could consider nitroglycerin patches

## 2016-09-27 NOTE — Progress Notes (Signed)
Samantha Ayers - 46 y.o. female MRN [id number]  Date of birth: 10/11/1970  SUBJECTIVE:  Including CC & ROS.  Chief Complaint  Patient presents with  . Foot Pain    Both feet X12months patient take ibuprofin and states it is helpin little and she needs a refill. patient states it feels like needles in her feet when she standing or walking   Samantha Ayers is a 46 year old female that is presenting with Left and right Achilles pain. She reports the pain has been occurring for about 2 months now. The pain is localized to the insertion of the Achilles. She reports some swelling in this area. The pain is worse after walking. She has tried ibuprofen with little improvement. She denies any prior surgery or injury to her Achilles. She has not tried any formal physical therapy. The pain is throbbing in nature. The pain is significant severity. She also has some pain in the plantar aspect of both feet. She feels like her symptoms are worse after she has been sitting for a prolonged period of time. She reports limited to no improvement with the medication she was provided at her office visit.   She was seen on 8/16 and diagnosed with plantar fasciitis and Achilles tendinitis. She is provided and intramuscular steroid injection, tramadol, and prednisone. She has no prior history of foot x-rays in her chart.   Review of Systems  Musculoskeletal: Negative for gait problem and joint swelling.  Skin: Negative for rash.  Neurological: Negative for weakness and numbness.  Hematological: Negative for adenopathy.   otherwise negative  HISTORY: Past Medical, Surgical, Social, and Family History Reviewed & Updated per EMR.   Pertinent Historical Findings include:  Past Medical History:  Diagnosis Date  . Allergy   . Asthma   . Diabetes mellitus   . Hypertension   . Obesity     History reviewed. No pertinent surgical history.  No Known Allergies  Family History  Problem Relation Age of Onset  . Diabetes  Mother   . Diabetes Father      Social History   Social History  . Marital status: Single    Spouse name: N/A  . Number of children: 3  . Years of education: 81   Occupational History  . Housekeeping    Social History Main Topics  . Smoking status: Never Smoker  . Smokeless tobacco: Never Used  . Alcohol use No  . Drug use: No  . Sexual activity: Yes    Birth control/ protection: IUD   Other Topics Concern  . Not on file   Social History Narrative   Fun: Cooking    Denies abuse and feels safe at home.      PHYSICAL EXAM:  VS: BP 118/80 (BP Location: Left Arm, Patient Position: Sitting, Cuff Size: Large)   Pulse 93   Temp 98.3 F (36.8 C) (Oral)   Ht 5\' 8"  (1.727 m)   Wt 258 lb (117 kg)   SpO2 100%   BMI 39.23 kg/m  Physical Exam Gen: NAD, alert, cooperative with exam,  ENT: normal lips, normal nasal mucosa,  Eye: normal EOM, normal conjunctiva and lids CV:  no edema, +2 pedal pulses   Resp: no accessory muscle use, non-labored,  Skin: no rashes, no areas of induration  Neuro: normal tone, normal sensation to touch Psych:  normal insight, alert and oriented MSK:  Left and right ankle/foot: Tenderness to palpation of the insertion of Achilles. No abnormal thickening of the Achilles bilaterally.  Normal range of motion. Normal strength to resistance. No tenderness to palpation over the lateral or medial malleolus Negative anterior drawer. Has a flatter foot. Some tenderness to palpation over the medial calcaneus bilaterally. Neurovascularly intact  Limited ultrasound: Left and right Achilles:  The left Achilles demonstrates thickening at the insertion but no increased hypervascularity  The right Achilles demonstrates thickening at the insertion but no increased hypervascularity.  Summary: Findings on ultrasound or normal in appearance  Ultrasound and interpretation by Clare Gandy, MD              ASSESSMENT & PLAN:   Plantar  fasciitis Unclear if she has plantar fasciitis based on exam.  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 Possible with some retrocalcaneal bursitis. Does not appear to be a tendinopathy. Advised that she doesn't have to take the prednisone prescribed for her. - Advised to obtain heel lifts. - Provided Pennsaid  - Provided home exercises - If no improvement can consider a retrocalcaneal bursa injection. Could consider advising orthotics. Could consider nitroglycerin patches  Obesity (BMI 30-39.9) Likely contributing to her symptoms. - Advised & counseled on weight loss.

## 2016-10-25 ENCOUNTER — Telehealth: Payer: Self-pay | Admitting: Family

## 2016-10-25 MED ORDER — TRAMADOL HCL 50 MG PO TABS
50.0000 mg | ORAL_TABLET | Freq: Four times a day (QID) | ORAL | 0 refills | Status: DC | PRN
Start: 2016-10-25 — End: 2016-11-23

## 2016-10-25 NOTE — Telephone Encounter (Signed)
Ok to refill with previous signature. 

## 2016-10-25 NOTE — Telephone Encounter (Signed)
Called refill into [facility] spoke w/pharmacist Melanie gave greg approval for tramadol. Per melanie her insurance will only cover for # 28 pills. Updated refill quantity and notified pt refill called into pharmacy...Raechel Chute

## 2016-10-25 NOTE — Telephone Encounter (Signed)
Pt called for a refill of hier traMADol (ULTRAM) 50 MG tablet  Please advise

## 2016-10-25 NOTE — Telephone Encounter (Signed)
Check [facility] registry last filled 09/27/2016.../lmb  

## 2016-11-16 ENCOUNTER — Telehealth: Payer: Self-pay | Admitting: Family Medicine

## 2016-11-16 NOTE — Telephone Encounter (Signed)
Patient states she has finished tramadol and Ibuprofen that Dr. Jordan Likes prescribed for her knee.  Patient states the pain is back and would like to know what the next step would be.

## 2016-11-16 NOTE — Telephone Encounter (Signed)
Patient will need to follow up with Dr. Jordan Likes. Message being forwarded.

## 2016-11-23 ENCOUNTER — Encounter: Payer: Self-pay | Admitting: Nurse Practitioner

## 2016-11-23 ENCOUNTER — Ambulatory Visit (INDEPENDENT_AMBULATORY_CARE_PROVIDER_SITE_OTHER): Payer: BC Managed Care – PPO | Admitting: Nurse Practitioner

## 2016-11-23 VITALS — BP 122/80 | HR 83 | Temp 98.5°F | Resp 16 | Ht 68.0 in | Wt 265.0 lb

## 2016-11-23 DIAGNOSIS — M25561 Pain in right knee: Secondary | ICD-10-CM | POA: Diagnosis not present

## 2016-11-23 DIAGNOSIS — Z23 Encounter for immunization: Secondary | ICD-10-CM | POA: Diagnosis not present

## 2016-11-23 DIAGNOSIS — Z0001 Encounter for general adult medical examination with abnormal findings: Secondary | ICD-10-CM | POA: Diagnosis not present

## 2016-11-23 DIAGNOSIS — E119 Type 2 diabetes mellitus without complications: Secondary | ICD-10-CM

## 2016-11-23 DIAGNOSIS — M25562 Pain in left knee: Secondary | ICD-10-CM

## 2016-11-23 DIAGNOSIS — G8929 Other chronic pain: Secondary | ICD-10-CM | POA: Diagnosis not present

## 2016-11-23 MED ORDER — MELOXICAM 7.5 MG PO TABS: 7.5000 mg | ORAL_TABLET | Freq: Every day | ORAL | 0 refills | Status: DC

## 2016-11-23 NOTE — Patient Instructions (Addendum)
I have ordered annual lab work for you. Please come back to the lab when you've been fasting to have these drawn. I will send your medication refills once I see your lab results.  I'd like for you to stop tramadol and ibuprofen. You may begin meloxicam once daily with food and topical pennsaid to your knees twice daily. I have provided you with a sample of the pennsaid today.  I have placed referrals for you to see sports medicine for your knees, opthamology for your eyes, and gynecology for your womens health. Our office will call you to schedule these appointments. You will have to call to schedule a dental appointment with a dental provider of your choice. You may want to check your insurance benefits to see who's in your network.  Remember to complete your breast exams at home each month, decrease your meat and carb intake, and try to get some exercise in when you can.  Thanks for letting me take care of you today :)

## 2016-11-23 NOTE — Assessment & Plan Note (Addendum)
Type 2 diabetes. Obtain A1c, urine microalbumin, CMET, lipid profile ordered. Shell return to lab when fasting for lab draw. Diabetic foot exam complete today. Referral to opthamology for eye exam. Will refill lisinopril-HCTZ and metformin at current dosage pending lab results. Prevnar 13 given today.

## 2016-11-23 NOTE — Assessment & Plan Note (Addendum)
TSH, CBC, CMET, Lipid panel, HIV screen ordered for annual lab work ordered today. She'll return when fasting for lab draw. Flu vaccine given. Referral to GYN for PAP/mammogram. Discussion of monthly breast exam, decreasing meat/carbs in diet and adding exercise to her weekly schedule.

## 2016-11-23 NOTE — Progress Notes (Addendum)
Subjective:    Patient ID: Samantha Ayers, female    DOB: 1970-02-10, 47 y.o.   MRN: [id number]  HPI  Samantha Ayers is a 46 yo female who presents today to establish care. She is transferring to me from another provider in the same clinic. She is requesting a complete physical exam. She c/o bilateral knee pain for several months. She's been taking ibuprofen and tramadol twice a day with no relief. Her pain is worse at the end of the day. She works 2 jobs on her feet all day.  Immunizations: Flu shot-today Tetanus-current Pneumonia-prevnar today Diet: Cooks at home. Eats some fruits and vegetables every day. Eats bread and pasta often. Meat at every meal. Exercise: Does not exercise regularly. Pap Smear & Mammogram: She'd like to go to GYN. Referral placed. Dentist: She is currently looking for a dentist. Vision: Needs opthamologist. Referral made today.   Review of Systems  Constitutional: Negative for activity change and appetite change.  HENT: Negative for congestion and sore throat.   Eyes: Negative for visual disturbance.  Respiratory: Negative for cough and shortness of breath.   Cardiovascular: Negative for chest pain and leg swelling.  Gastrointestinal: Negative for abdominal pain, constipation and diarrhea.  Endocrine: Negative for cold intolerance and heat intolerance.  Genitourinary: Negative for dysuria, vaginal bleeding and vaginal discharge.  Musculoskeletal: Positive for arthralgias and neck pain.  Skin: Negative for rash.  Allergic/Immunologic: Negative for environmental allergies and food allergies.  Neurological: Negative for dizziness and weakness.  Hematological: Negative for adenopathy. Does not bruise/bleed easily.  Psychiatric/Behavioral:       Denies anxiety, depression     Past Medical History:  Diagnosis Date  . Allergy   . Asthma   . Diabetes mellitus   . Hypertension   . Obesity      Social History   Social History  . Marital status:  Married    Spouse name: N/A  . Number of children: 3  . Years of education: 92   Occupational History  . Housekeeping   . other    Social History Main Topics  . Smoking status: Never Smoker  . Smokeless tobacco: Never Used  . Alcohol use No  . Drug use: No  . Sexual activity: Not Currently    Partners: Male    Birth control/ protection: IUD     Comment: regular periods    Other Topics Concern  . Not on file   Social History Narrative   Married. Immigrated from [place] 16 years ago.   Speaks English well and actively learning more.   She has 3 children which she care for alone right now, husband has returned to [place].   She works 2 jobs and active in her children's activities.   Denies abuse and feels safe at home.    Enjoys cooking.    No past surgical history on file.  Family History  Problem Relation Age of Onset  . Diabetes Mother   . Diabetes Father     No Known Allergies  Current Outpatient Prescriptions on File Prior to Visit  Medication Sig Dispense Refill  . Blood Glucose Monitoring Suppl (ONE TOUCH ULTRA MINI) w/Device KIT Use meter to check blood sugars 1-4 times daily as instructed. 1 each 0  . Diclofenac Sodium (PENNSAID) 2 % SOLN Place 1 application onto the skin 2 (two) times daily. 1 Bottle 1  . glucose blood (ONE TOUCH ULTRA TEST) test strip Use one strip per test. Test blood sugars 1-4  times daily as instructed. 100 each 12  . Lancets Misc. (ONE TOUCH SURESOFT) MISC Use 1 lancet per test. Test blood sugars 1-4 times per day as instructed. 1 each 1  . levocetirizine (XYZAL) 5 MG tablet Take 1 tablet (5 mg total) by mouth every evening. 90 tablet 3  . lisinopril-hydrochlorothiazide (PRINZIDE,ZESTORETIC) 20-12.5 MG tablet TAKE ONE TABLET BY MOUTH ONCE DAILY 90 tablet 1  . metFORMIN (GLUCOPHAGE) 1000 MG tablet TAKE ONE TABLET BY MOUTH TWICE DAILY WITH  A  MEAL 180 tablet 1  . rosuvastatin (CRESTOR) 20 MG tablet Take 1 tablet (20 mg total) by mouth daily.  (Patient not taking: Reported on 09/27/2016) 30 tablet 2   No current facility-administered medications on file prior to visit.     BP 122/80 (BP Location: Left Arm, Patient Position: Sitting, Cuff Size: Large)   Pulse 83   Temp 98.5 F (36.9 C) (Oral)   Resp 16   Ht _0  (1.727 m)   Wt 265 lb (120.2 kg)   SpO2 98%   BMI 40.29 kg/m       Objective:   Physical Exam  Constitutional: She is oriented to person, place, and time. Obese BMI. No distress.  HENT:  Head: Normocephalic and atraumatic.  Right Ear: Tympanic membrane and ear canal normal. Bony landmarks visible. Left Ear: Tympanic membrane and ear canal normal. Bony landmarks visible. Mouth/Throat: Oropharynx is clear and moist.  Eyes: Pupils are equal, round, and reactive to light. No scleral icterus.  Neck: Normal range of motion. No thyromegaly present.  Cardiovascular: Normal rate and regular rhythm.   No murmur heard. Pulmonary/Chest: Effort normal and breath sounds normal. No respiratory distress. She has no wheezes. She has no rales. She exhibits no tenderness.  Abdominal: Soft. Bowel sounds are normal. She exhibits no distension and no mass. There is no tenderness. There is no rebound and no guarding.  Musculoskeletal: She exhibits no edema, no deformity. Normal ROM. Lymphadenopathy:    She has no cervical adenopathy.  Neurological: She is alert and oriented to person, place, and time. She has normal patellar reflexes. She exhibits normal muscle tone. Coordination normal.  Skin: Skin is warm and dry.  Psychiatric: She has a normal mood and affect. Her behavior is normal. Judgment and thought content normal.    Diabetic Foot Exam - Simple   Simple Foot Form Diabetic Foot exam was performed with the following findings:  Yes   Visual Inspection No deformities, no ulcerations, no other skin breakdown bilaterally:  Yes Sensation Testing Intact to touch and monofilament testing bilaterally:  Yes Pulse  Check Posterior Tibialis and Dorsalis pulse intact bilaterally:  Yes Comments         Assessment & Plan:  Chronic pain of both knees-Suspect osteoarthritis. Discontinued ibuprofen, tramadol as she reports no relief with these therapies. Provided a sample of topical pennsaid and sent prescription for mobic once daily. Referral to sports medicine placed.

## 2016-11-24 ENCOUNTER — Other Ambulatory Visit: Payer: Self-pay | Admitting: Family

## 2016-11-24 DIAGNOSIS — I10 Essential (primary) hypertension: Secondary | ICD-10-CM

## 2016-11-28 ENCOUNTER — Other Ambulatory Visit: Payer: Self-pay | Admitting: *Deleted

## 2016-11-28 DIAGNOSIS — I10 Essential (primary) hypertension: Secondary | ICD-10-CM

## 2016-11-28 MED ORDER — LISINOPRIL-HYDROCHLOROTHIAZIDE 20-12.5 MG PO TABS: 1.0000 | ORAL_TABLET | Freq: Every day | ORAL | 1 refills | Status: DC

## 2016-12-05 ENCOUNTER — Other Ambulatory Visit (INDEPENDENT_AMBULATORY_CARE_PROVIDER_SITE_OTHER): Payer: BC Managed Care – PPO

## 2016-12-05 DIAGNOSIS — Z0001 Encounter for general adult medical examination with abnormal findings: Secondary | ICD-10-CM

## 2016-12-05 DIAGNOSIS — Z0000 Encounter for general adult medical examination without abnormal findings: Secondary | ICD-10-CM

## 2016-12-05 LAB — CBC
HEMATOCRIT: 36.3 % (ref 36.0–46.0)
Hemoglobin: 12.1 g/dL (ref 12.0–15.0)
MCHC: 33.3 g/dL (ref 30.0–36.0)
MCV: 73.1 fl — AB (ref 78.0–100.0)
Platelets: 386 10*3/uL (ref 150.0–400.0)
RBC: 4.96 Mil/uL (ref 3.87–5.11)
RDW: 19.2 % — AB (ref 11.5–15.5)
WBC: 8.1 10*3/uL (ref 4.0–10.5)

## 2016-12-05 LAB — MICROALBUMIN / CREATININE URINE RATIO
CREATININE, U: 191.9 mg/dL
MICROALB UR: 1.3 mg/dL (ref 0.0–1.9)
MICROALB/CREAT RATIO: 0.7 mg/g (ref 0.0–30.0)

## 2016-12-05 LAB — COMPREHENSIVE METABOLIC PANEL
ALBUMIN: 3.8 g/dL (ref 3.5–5.2)
ALT: 12 U/L (ref 0–35)
AST: 12 U/L (ref 0–37)
Alkaline Phosphatase: 65 U/L (ref 39–117)
BUN: 6 mg/dL (ref 6–23)
CO2: 26 mEq/L (ref 19–32)
Calcium: 9.1 mg/dL (ref 8.4–10.5)
Chloride: 99 mEq/L (ref 96–112)
Creatinine, Ser: 0.42 mg/dL (ref 0.40–1.20)
GFR: 210.8 mL/min (ref >60.00)
GLUCOSE: 168 mg/dL — AB (ref 70–99)
POTASSIUM: 3.8 meq/L (ref 3.5–5.1)
SODIUM: 137 meq/L (ref 135–145)
Total Bilirubin: 0.4 mg/dL (ref 0.2–1.2)
Total Protein: 7.6 g/dL (ref 6.0–8.3)

## 2016-12-05 LAB — LIPID PANEL
CHOL/HDL RATIO: 4
CHOLESTEROL: 156 mg/dL (ref 0–200)
HDL: 39.9 mg/dL (ref >39.00)
LDL CALC: 92 mg/dL (ref 0–99)
NonHDL: 115.66
TRIGLYCERIDES: 120 mg/dL (ref 0.0–149.0)
VLDL: 24 mg/dL (ref 0.0–40.0)

## 2016-12-05 LAB — TSH: TSH: 0.54 u[IU]/mL (ref 0.35–4.50)

## 2016-12-06 ENCOUNTER — Encounter: Payer: Self-pay | Admitting: Family Medicine

## 2016-12-06 ENCOUNTER — Encounter: Payer: Self-pay | Admitting: Women's Health

## 2016-12-06 ENCOUNTER — Ambulatory Visit (INDEPENDENT_AMBULATORY_CARE_PROVIDER_SITE_OTHER): Payer: BC Managed Care – PPO | Admitting: Women's Health

## 2016-12-06 ENCOUNTER — Ambulatory Visit (INDEPENDENT_AMBULATORY_CARE_PROVIDER_SITE_OTHER): Payer: BC Managed Care – PPO | Admitting: Family Medicine

## 2016-12-06 VITALS — BP 132/80 | Ht 68.0 in | Wt 262.0 lb

## 2016-12-06 VITALS — BP 128/72 | HR 87 | Temp 98.7°F | Ht 68.0 in | Wt 258.0 lb

## 2016-12-06 DIAGNOSIS — E669 Obesity, unspecified: Secondary | ICD-10-CM | POA: Diagnosis not present

## 2016-12-06 DIAGNOSIS — Z01419 Encounter for gynecological examination (general) (routine) without abnormal findings: Secondary | ICD-10-CM

## 2016-12-06 DIAGNOSIS — M722 Plantar fascial fibromatosis: Secondary | ICD-10-CM | POA: Diagnosis not present

## 2016-12-06 DIAGNOSIS — M25561 Pain in right knee: Secondary | ICD-10-CM

## 2016-12-06 LAB — HEMOGLOBIN A1C
EAG (MMOL/L): 12.5 (calc)
Hgb A1c MFr Bld: 9.5 % of total Hgb — ABNORMAL HIGH (ref <5.7)
MEAN PLASMA GLUCOSE: 226 (calc)

## 2016-12-06 LAB — HIV ANTIBODY (ROUTINE TESTING W REFLEX): HIV 1&2 Ab, 4th Generation: NONREACTIVE

## 2016-12-06 NOTE — Assessment & Plan Note (Signed)
Has had improvement in her symptoms. - Continue Pennsaid - Encouraged soft gel insoles. Can consider gel heel cups - Could consider injections if fails to improve

## 2016-12-06 NOTE — Assessment & Plan Note (Signed)
Counseled that continued weight loss will significantly help her joint pain

## 2016-12-06 NOTE — Addendum Note (Signed)
Addended by: Tito DineBONHAM, KIM A on: 12/06/2016 01:58 PM   Modules accepted: Orders

## 2016-12-06 NOTE — Patient Instructions (Signed)
Thank you for coming in,   Please continue to perform the exercises for your legs.   Please try the new exercises for you hips and your knee.   Please let me know if your symptoms do not improve.     Please feel free to call with any questions or concerns at any time, at [phone]. --Dr. Jordan LikesSchmitz

## 2016-12-06 NOTE — Progress Notes (Signed)
Samantha Ayers - 46 y.o. female MRN [id number]  Date of birth: 08/10/1970  SUBJECTIVE:  Including CC & ROS.  Chief Complaint  Patient presents with  . Knee Pain    Right knee pain present for one month. She has been taking Mobic, which has helped. Pain present when walking, pain on the botthoms of her feet.     [name] is a 46 y.o. female that is presenting with new right knee pain and following up for her bilateral plantar fasciitis. She reports the knee pain is acute on chronic in nature. The pain is generalized over the anterior aspect of her knee. She denies any injury or surgery to her knee. She has been taking meloxicam which significantly improved her pain. She denies any locking or giving way. She denies any swelling.Marland Kitchen  Has had continued plantar heel pain. It has improved with stretching and meloxicam. She has not tried gel heel cups. Has not tried the midfoot arch strap. Has not tried physical therapy. Denies any radicular type symptoms. The pain is localized and sharp. The pain is worse in the morning.  Independent review of previous ultrasound of the Achilles shows thickening at the insertion  Patient was seen on 8/16 and diagnosis of plantar fasciitis and Achilles tendinitis. At that time she was provided intramuscular steroid injection, tramadol, and prednisone.  Review of Systems  Constitutional: Negative for fever.  Musculoskeletal: Negative for gait problem and joint swelling.  Skin: Negative for color change.  Neurological: Negative for weakness.    HISTORY: Past Medical, Surgical, Social, and Family History Reviewed & Updated per EMR.   Pertinent Historical Findings include:  Past Medical History:  Diagnosis Date  . Allergy   . Asthma   . Diabetes mellitus   . Hypertension   . Obesity     No past surgical history on file.  No Known Allergies  Family History  Problem Relation Age of Onset  . Diabetes Mother   . Diabetes Father      Social History    Social History  . Marital status: Married    Spouse name: N/A  . Number of children: 3  . Years of education: 57   Occupational History  . Housekeeping   . other    Social History Main Topics  . Smoking status: Never Smoker  . Smokeless tobacco: Never Used  . Alcohol use No  . Drug use: No  . Sexual activity: Not Currently    Partners: Male    Birth control/ protection: IUD     Comment: regular periods , intercourse age 5, less than 5 sexual partners   Other Topics Concern  . Not on file   Social History Narrative   Married. Immigrated from [place] 16 years ago.   Speaks English well and actively learning more.   She has 3 children which she care for alone right now, husband has returned to [place].   She works 2 jobs and active in her children's activities.   Denies abuse and feels safe at home.    Enjoys cooking.     PHYSICAL EXAM:  VS: BP 128/72 (BP Location: Left Arm, Patient Position: Sitting, Cuff Size: Large)   Pulse 87   Temp 98.7 F (37.1 C) (Oral)   Ht 5\' 8"  (1.727 m)   Wt 258 lb (117 kg)   LMP 11/15/2016   SpO2 99%   BMI 39.23 kg/m  Physical Exam Gen: NAD, alert, cooperative with exam, well-appearing ENT: normal lips, normal nasal  mucosa,  Eye: normal EOM, normal conjunctiva and lids CV:  no edema, +2 pedal pulses   Resp: no accessory muscle use, non-labored,   Skin: no rashes, no areas of induration  Neuro: normal tone, normal sensation to touch Psych:  normal insight, alert and oriented MSK:  Left and right foot. No abnormal ecchymosis or swelling. Normal ankle range of motion. Normal inversion and eversion. Tenderness to palpation over the calcaneus on the plantar aspect. Right knee: No significant tenderness to palpation over the medial lateral joint line. No tenderness to palpation of the quadrant or patellar tendon. No effusion. Normal flexion and extension. Normal strength revisit his stents. Normal endpoints with valgus and varus  stress testing. Negative McMurray's test No pain patellar compression and grind  Neurovascularly intact  Limited ultrasound: Right knee:  No effusion in the suprapatellar pouch Medial joint space narrowing and arthritic changes of the medial compartment  Summary: Arthritic changes within the medial joint line  Ultrasound and interpretation by Clare GandyJeremy Raychel Dowler, MD            ASSESSMENT & PLAN:   Acute pain of right knee Pain is likely multifactorial in nature. 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  - Can continue meloxicam. - Counseled on home exercises - If no improvement would consider x-rays and consider injection  Obesity (BMI 30-39.9) Counseled that continued weight loss will significantly help her joint pain  Plantar fasciitis Has had improvement in her symptoms. - Continue Pennsaid - Encouraged soft gel insoles. Can consider gel heel cups - Could consider injections if fails to improve

## 2016-12-06 NOTE — Patient Instructions (Addendum)
[facility] center   [phone]   Carbohydrate Counting for Diabetes Mellitus, Adult Carbohydrate counting is a method for keeping track of how many carbohydrates you eat. Eating carbohydrates naturally increases the amount of sugar (glucose) in the blood. Counting how many carbohydrates you eat helps keep your blood glucose within normal limits, which helps you manage your diabetes (diabetes mellitus). It is important to know how many carbohydrates you can safely have in each meal. This is different for every person. A diet and nutrition specialist (registered dietitian) can help you make a meal plan and calculate how many carbohydrates you should have at each meal and snack. Carbohydrates are found in the following foods:  Grains, such as breads and cereals.  Dried beans and soy products.  Starchy vegetables, such as potatoes, peas, and corn.  Fruit and fruit juices.  Milk and yogurt.  Sweets and snack foods, such as cake, cookies, candy, chips, and soft drinks.  How do I count carbohydrates? There are two ways to count carbohydrates in food. You can use either of the methods or a combination of both. Reading "Nutrition Facts" on packaged food The "Nutrition Facts" list is included on the labels of almost all packaged foods and beverages in the [place] It includes:  The serving size.  Information about nutrients in each serving, including the grams (g) of carbohydrate per serving.  To use the "Nutrition Facts":  Decide how many servings you will have.  Multiply the number of servings by the number of carbohydrates per serving.  The resulting number is the total amount of carbohydrates that you will be having.  Learning standard serving sizes of other foods When you eat foods containing carbohydrates that are not packaged or do not include "Nutrition Facts" on the label, you need to measure the servings in order to count the amount of carbohydrates:  Measure the foods that you will eat  with a food scale or measuring cup, if needed.  Decide how many standard-size servings you will eat.  Multiply the number of servings by 15. Most carbohydrate-rich foods have about 15 g of carbohydrates per serving. ? For example, if you eat 8 oz (170 g) of strawberries, you will have eaten 2 servings and 30 g of carbohydrates (2 servings x 15 g = 30 g).  For foods that have more than one food mixed, such as soups and casseroles, you must count the carbohydrates in each food that is included.  The following list contains standard serving sizes of common carbohydrate-rich foods. Each of these servings has about 15 g of carbohydrates:   hamburger bun or  English muffin.   oz (15 mL) syrup.   oz (14 g) jelly.  1 slice of bread.  1 six-inch tortilla.  3 oz (85 g) cooked rice or pasta.  4 oz (113 g) cooked dried beans.  4 oz (113 g) starchy vegetable, such as peas, corn, or potatoes.  4 oz (113 g) hot cereal.  4 oz (113 g) mashed potatoes or  of a large baked potato.  4 oz (113 g) canned or frozen fruit.  4 oz (120 mL) fruit juice.  4-6 crackers.  6 chicken nuggets.  6 oz (170 g) unsweetened dry cereal.  6 oz (170 g) plain fat-free yogurt or yogurt sweetened with artificial sweeteners.  8 oz (240 mL) milk.  8 oz (170 g) fresh fruit or one small piece of fruit.  24 oz (680 g) popped popcorn.  Example of carbohydrate counting Sample meal    3 oz (85 g) chicken breast.  6 oz (170 g) brown rice.  4 oz (113 g) corn.  8 oz (240 mL) milk.  8 oz (170 g) strawberries with sugar-free whipped topping. Carbohydrate calculation 1. Identify the foods that contain carbohydrates: ? Rice. ? Corn. ? Milk. ? Strawberries. 2. Calculate how many servings you have of each food: ? 2 servings rice. ? 1 serving corn. ? 1 serving milk. ? 1 serving strawberries. 3. Multiply each number of servings by 15 g: ? 2 servings rice x 15 g = 30 g. ? 1 serving corn x 15 g = 15  g. ? 1 serving milk x 15 g = 15 g. ? 1 serving strawberries x 15 g = 15 g. 4. Add together all of the amounts to find the total grams of carbohydrates eaten: ? 30 g + 15 g + 15 g + 15 g = 75 g of carbohydrates total. This information is not intended to replace advice given to you by your health care provider. Make sure you discuss any questions you have with your health care provider. Document Released: 01/24/2005 Document Revised: 08/14/2015 Document Reviewed: 07/08/2015 Elsevier Interactive Patient Education  2018 Elsevier Inc. Health Maintenance, Female Adopting a healthy lifestyle and getting preventive care can go a long way to promote health and wellness. Talk with your health care provider about what schedule of regular examinations is right for you. This is a good chance for you to check in with your provider about disease prevention and staying healthy. In between checkups, there are plenty of things you can do on your own. Experts have done a lot of research about which lifestyle changes and preventive measures are most likely to keep you healthy. Ask your health care provider for more information. Weight and diet Eat a healthy diet  Be sure to include plenty of vegetables, fruits, low-fat dairy products, and lean protein.  Do not eat a lot of foods high in solid fats, added sugars, or salt.  Get regular exercise. This is one of the most important things you can do for your health. ? Most adults should exercise for at least 150 minutes each week. The exercise should increase your heart rate and make you sweat (moderate-intensity exercise). ? Most adults should also do strengthening exercises at least twice a week. This is in addition to the moderate-intensity exercise.  Maintain a healthy weight  Body mass index (BMI) is a measurement that can be used to identify possible weight problems. It estimates body fat based on height and weight. Your health care provider can help determine  your BMI and help you achieve or maintain a healthy weight.  For females 20 years of age and older: ? A BMI below 18.5 is considered underweight. ? A BMI of 18.5 to 24.9 is normal. ? A BMI of 25 to 29.9 is considered overweight. ? A BMI of 30 and above is considered obese.  Watch levels of cholesterol and blood lipids  You should start having your blood tested for lipids and cholesterol at 44 years of age, then have this test every 5 years.  You may need to have your cholesterol levels checked more often if: ? Your lipid or cholesterol levels are high. ? You are older than 44 years of age. ? You are at high risk for heart disease.  Cancer screening Lung Cancer  Lung cancer screening is recommended for adults 55-80 years old who are at high risk for lung cancer because   of a history of smoking.  A yearly low-dose CT scan of the lungs is recommended for people who: ? Currently smoke. ? Have quit within the past 15 years. ? Have at least a 30-pack-year history of smoking. A pack year is smoking an average of one pack of cigarettes a day for 1 year.  Yearly screening should continue until it has been 15 years since you quit.  Yearly screening should stop if you develop a health problem that would prevent you from having lung cancer treatment.  Breast Cancer  Practice breast self-awareness. This means understanding how your breasts normally appear and feel.  It also means doing regular breast self-exams. Let your health care provider know about any changes, no matter how small.  If you are in your 20s or 30s, you should have a clinical breast exam (CBE) by a health care provider every 1-3 years as part of a regular health exam.  If you are 40 or older, have a CBE every year. Also consider having a breast X-ray (mammogram) every year.  If you have a family history of breast cancer, talk to your health care provider about genetic screening.  If you are at high risk for breast  cancer, talk to your health care provider about having an MRI and a mammogram every year.  Breast cancer gene (BRCA) assessment is recommended for women who have family members with BRCA-related cancers. BRCA-related cancers include: ? Breast. ? Ovarian. ? Tubal. ? Peritoneal cancers.  Results of the assessment will determine the need for genetic counseling and BRCA1 and BRCA2 testing.  Cervical Cancer Your health care provider may recommend that you be screened regularly for cancer of the pelvic organs (ovaries, uterus, and vagina). This screening involves a pelvic examination, including checking for microscopic changes to the surface of your cervix (Pap test). You may be encouraged to have this screening done every 3 years, beginning at age 21.  For women ages 30-65, health care providers may recommend pelvic exams and Pap testing every 3 years, or they may recommend the Pap and pelvic exam, combined with testing for human papilloma virus (HPV), every 5 years. Some types of HPV increase your risk of cervical cancer. Testing for HPV may also be done on women of any age with unclear Pap test results.  Other health care providers may not recommend any screening for nonpregnant women who are considered low risk for pelvic cancer and who do not have symptoms. Ask your health care provider if a screening pelvic exam is right for you.  If you have had past treatment for cervical cancer or a condition that could lead to cancer, you need Pap tests and screening for cancer for at least 20 years after your treatment. If Pap tests have been discontinued, your risk factors (such as having a new sexual partner) need to be reassessed to determine if screening should resume. Some women have medical problems that increase the chance of getting cervical cancer. In these cases, your health care provider may recommend more frequent screening and Pap tests.  Colorectal Cancer  This type of cancer can be detected  and often prevented.  Routine colorectal cancer screening usually begins at 44 years of age and continues through 44 years of age.  Your health care provider may recommend screening at an earlier age if you have risk factors for colon cancer.  Your health care provider may also recommend using home test kits to check for hidden blood in the stool.    A small camera at the end of a tube can be used to examine your colon directly (sigmoidoscopy or colonoscopy). This is done to check for the earliest forms of colorectal cancer.  Routine screening usually begins at age 50.  Direct examination of the colon should be repeated every 5-10 years through 44 years of age. However, you may need to be screened more often if early forms of precancerous polyps or small growths are found.  Skin Cancer  Check your skin from head to toe regularly.  Tell your health care provider about any new moles or changes in moles, especially if there is a change in a mole's shape or color.  Also tell your health care provider if you have a mole that is larger than the size of a pencil eraser.  Always use sunscreen. Apply sunscreen liberally and repeatedly throughout the day.  Protect yourself by wearing long sleeves, pants, a wide-brimmed hat, and sunglasses whenever you are outside.  Heart disease, diabetes, and high blood pressure  High blood pressure causes heart disease and increases the risk of stroke. High blood pressure is more likely to develop in: ? People who have blood pressure in the high end of the normal range (130-139/85-89 mm Hg). ? People who are overweight or obese. ? People who are African American.  If you are 18-39 years of age, have your blood pressure checked every 3-5 years. If you are 40 years of age or older, have your blood pressure checked every year. You should have your blood pressure measured twice-once when you are at a hospital or clinic, and once when you are not at a hospital or  clinic. Record the average of the two measurements. To check your blood pressure when you are not at a hospital or clinic, you can use: ? An automated blood pressure machine at a pharmacy. ? A home blood pressure monitor.  If you are between 55 years and 79 years old, ask your health care provider if you should take aspirin to prevent strokes.  Have regular diabetes screenings. This involves taking a blood sample to check your fasting blood sugar level. ? If you are at a normal weight and have a low risk for diabetes, have this test once every three years after 45 years of age. ? If you are overweight and have a high risk for diabetes, consider being tested at a younger age or more often. Preventing infection Hepatitis B  If you have a higher risk for hepatitis B, you should be screened for this virus. You are considered at high risk for hepatitis B if: ? You were born in a country where hepatitis B is common. Ask your health care provider which countries are considered high risk. ? Your parents were born in a high-risk country, and you have not been immunized against hepatitis B (hepatitis B vaccine). ? You have HIV or AIDS. ? You use needles to inject street drugs. ? You live with someone who has hepatitis B. ? You have had sex with someone who has hepatitis B. ? You get hemodialysis treatment. ? You take certain medicines for conditions, including cancer, organ transplantation, and autoimmune conditions.  Hepatitis C  Blood testing is recommended for: ? Everyone born from 1945 through 1965. ? Anyone with known risk factors for hepatitis C.  Sexually transmitted infections (STIs)  You should be screened for sexually transmitted infections (STIs) including gonorrhea and chlamydia if: ? You are sexually active and are younger than 24 years   age. ? You are older than 44 years of age and your health care provider tells you that you are at risk for this type of infection. ? Your  sexual activity has changed since you were last screened and you are at an increased risk for chlamydia or gonorrhea. Ask your health care provider if you are at risk.  If you do not have HIV, but are at risk, it may be recommended that you take a prescription medicine daily to prevent HIV infection. This is called pre-exposure prophylaxis (PrEP). You are considered at risk if: ? You are sexually active and do not regularly use condoms or know the HIV status of your partner(s). ? You take drugs by injection. ? You are sexually active with a partner who has HIV.  Talk with your health care provider about whether you are at high risk of being infected with HIV. If you choose to begin PrEP, you should first be tested for HIV. You should then be tested every 3 months for as long as you are taking PrEP. Pregnancy  If you are premenopausal and you may become pregnant, ask your health care provider about preconception counseling.  If you may become pregnant, take 400 to 800 micrograms (mcg) of folic acid every day.  If you want to prevent pregnancy, talk to your health care provider about birth control (contraception). Osteoporosis and menopause  Osteoporosis is a disease in which the bones lose minerals and strength with aging. This can result in serious bone fractures. Your risk for osteoporosis can be identified using a bone density scan.  If you are 53 years of age or older, or if you are at risk for osteoporosis and fractures, ask your health care provider if you should be screened.  Ask your health care provider whether you should take a calcium or vitamin D supplement to lower your risk for osteoporosis.  Menopause may have certain physical symptoms and risks.  Hormone replacement therapy may reduce some of these symptoms and risks. Talk to your health care provider about whether hormone replacement therapy is right for you. Follow these instructions at home:  Schedule regular health,  dental, and eye exams.  Stay current with your immunizations.  Do not use any tobacco products including cigarettes, chewing tobacco, or electronic cigarettes.  If you are pregnant, do not drink alcohol.  If you are breastfeeding, limit how much and how often you drink alcohol.  Limit alcohol intake to no more than 1 drink per day for nonpregnant women. One drink equals 12 ounces of beer, 5 ounces of wine, or 1 ounces of hard liquor.  Do not use street drugs.  Do not share needles.  Ask your health care provider for help if you need support or information about quitting drugs.  Tell your health care provider if you often feel depressed.  Tell your health care provider if you have ever been abused or do not feel safe at home. This information is not intended to replace advice given to you by your health care provider. Make sure you discuss any questions you have with your health care provider. Document Released: 08/09/2010 Document Revised: 07/02/2015 Document Reviewed: 10/28/2014 Elsevier Interactive Patient Education  2018 Elsevier Inc. Human Papillomavirus Quadrivalent Vaccine suspension for injection What is this medicine? HUMAN PAPILLOMAVIRUS VACCINE (HYOO muhn pap uh LOH muh vahy ruhs vak SEEN) is a vaccine. It is used to prevent infections of four types of the human papillomavirus. In women, the vaccine may lower your  risk of getting cervical, vaginal, vulvar, or anal cancer and genital warts. In men, the vaccine may lower your risk of getting genital warts and anal cancer. You cannot get these diseases from the vaccine. This vaccine does not treat these diseases. This medicine may be used for other purposes; ask your health care provider or pharmacist if you have questions. COMMON BRAND NAME(S): Gardasil What should I tell my health care provider before I take this medicine? They need to know if you have any of these conditions: -fever or infection -hemophilia -HIV infection  or AIDS -immune system problems -low platelet count -an unusual reaction to Human Papillomavirus Vaccine, yeast, other medicines, foods, dyes, or preservatives -pregnant or trying to get pregnant -breast-feeding How should I use this medicine? This vaccine is for injection in a muscle on your upper arm or thigh. It is given by a health care professional. Bonita Quin will be observed for 15 minutes after each dose. Sometimes, fainting happens after the vaccine is given. You may be asked to sit or lie down during the 15 minutes. Three doses are given. The second dose is given 2 months after the first dose. The last dose is given 4 months after the second dose. A copy of a Vaccine Information Statement will be given before each vaccination. Read this sheet carefully each time. The sheet may change frequently. Talk to your pediatrician regarding the use of this medicine in children. While this drug may be prescribed for children as Amed Datta as 51 years of age for selected conditions, precautions do apply. Overdosage: If you think you have taken too much of this medicine contact a poison control center or emergency room at once. NOTE: This medicine is only for you. Do not share this medicine with others. What if I miss a dose? All 3 doses of the vaccine should be given within 6 months. Remember to keep appointments for follow-up doses. Your health care provider will tell you when to return for the next vaccine. Ask your health care professional for advice if you are unable to keep an appointment or miss a scheduled dose. What may interact with this medicine? -other vaccines This list may not describe all possible interactions. Give your health care provider a list of all the medicines, herbs, non-prescription drugs, or dietary supplements you use. Also tell them if you smoke, drink alcohol, or use illegal drugs. Some items may interact with your medicine. What should I watch for while using this medicine? This  vaccine may not fully protect everyone. Continue to have regular pelvic exams and cervical or anal cancer screenings as directed by your doctor. The Human Papillomavirus is a sexually transmitted disease. It can be passed by any kind of sexual activity that involves genital contact. The vaccine works best when given before you have any contact with the virus. Many people who have the virus do not have any signs or symptoms. Tell your doctor or health care professional if you have any reaction or unusual symptom after getting the vaccine. What side effects may I notice from receiving this medicine? Side effects that you should report to your doctor or health care professional as soon as possible: -allergic reactions like skin rash, itching or hives, swelling of the face, lips, or tongue -breathing problems -feeling faint or lightheaded, falls Side effects that usually do not require medical attention (report to your doctor or health care professional if they continue or are bothersome): -cough -dizziness -fever -headache -nausea -redness, warmth, swelling, pain, or  itching at site where injected This list may not describe all possible side effects. Call your doctor for medical advice about side effects. You may report side effects to FDA at 1-800-FDA-1088. Where should I keep my medicine? This drug is given in a hospital or clinic and will not be stored at home. NOTE: This sheet is a summary. It may not cover all possible information. If you have questions about this medicine, talk to your doctor, pharmacist, or health care provider.  2018 Elsevier/Gold Standard (2013-03-18 13:14:33)

## 2016-12-06 NOTE — Progress Notes (Signed)
Samantha BersMaimouna Ayers 08/06/1970 [id number]    History:    Presents for new patient annual exam. Regular monthly cycle, ParaGard IUD placed 2010. States feels the strings monthly. Not sexually active for greater than 2 years. [place], husband went back home to visit 2 years ago and did not return. No infidelity prior to leaving. Reports normal Pap and mammogram history, last mammogram 2015. Diabetes and hypertension managed by primary care.   Past medical history, past surgical history, family history and social history were all reviewed and documented in the EPIC chart. 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 Daughters are 5715, 112 and 188 , 44 year old type 2 diabetes.  ROS:  A ROS was performed and pertinent positives and negatives are included.  Exam:  Vitals:   12/06/16 1231  BP: 132/80  Weight: 262 lb (118.8 kg)  Height: 5\' 8"  (1.727 m)   Body mass index is 39.84 kg/m.   General appearance:  Normal Thyroid:  Symmetrical, normal in size, without palpable masses or nodularity. Respiratory  Auscultation:  Clear without wheezing or rhonchi Cardiovascular  Auscultation:  Regular rate, without rubs, murmurs or gallops  Edema/varicosities:  Not grossly evident Abdominal  Soft,nontender, without masses, guarding or rebound.  Liver/spleen:  No organomegaly noted  Hernia:  None appreciated  Skin  Inspection:  Grossly normal   Breasts: Examined lying and sitting.     Right: Without masses, retractions, discharge or axillary adenopathy.     Left: Without masses, retractions, discharge or axillary adenopathy. Gentitourinary   Inguinal/mons:  Normal without inguinal adenopathy  External genitalia:  Normal  BUS/Urethra/Skene's glands:  Normal  Vagina:  Normal  Cervix:  Normal IUD strings not visible, patient reports able to feel them week after cycle monthly.  Uterus:  normal in size, shape and contour.  Midline and mobile  Adnexa/parametria:     Rt: Without masses or  tenderness.   Lt: Without masses or tenderness.  Anus and perineum: Normal  Digital rectal exam: Normal sphincter tone without palpated masses or tenderness  Assessment/Plan:  46 y.o. DBF G3 P3 for annual exam with no complaints.  ParaGard IUD 269010-monthly cycle Hypertension, diabetes primary care manages labs and meds Morbid obesity  Plan: Reviewed unable to visually seeing or feel ParaGard IUD strings, states is able to feel them monthly after her cycle, declines need for ultrasound to check placement. Not sexually active in greater than 2 years, reviewed importance of condoms if become sexually active. Denies need for STD screen. SBE's, annual screening mammogram, breast center information given instructed to schedule, calcium rich diet, vitamin D 1000 daily encouraged. Reviewed importance of increasing exercise, cardia type and decreasing carbohydrates and diet. Pap with HR HPV typing, new screening guidelines reviewed.    Harrington Challengerancy J Young [facility] LLCWHNP, 1:18 PM 12/06/2016

## 2016-12-06 NOTE — Assessment & Plan Note (Signed)
Pain is likely multifactorial in nature. 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  - Can continue meloxicam. - Counseled on home exercises - If no improvement would consider x-rays and consider injection

## 2016-12-07 ENCOUNTER — Other Ambulatory Visit: Payer: Self-pay | Admitting: Women's Health

## 2016-12-07 DIAGNOSIS — Z139 Encounter for screening, unspecified: Secondary | ICD-10-CM

## 2016-12-07 LAB — URINALYSIS W MICROSCOPIC + REFLEX CULTURE
BILIRUBIN URINE: NEGATIVE
Bacteria, UA: NONE SEEN /HPF
HGB URINE DIPSTICK: NEGATIVE
Hyaline Cast: NONE SEEN /LPF
KETONES UR: NEGATIVE
LEUKOCYTE ESTERASE: NEGATIVE
NITRITES URINE, INITIAL: NEGATIVE
PH: 5.5 (ref 5.0–8.0)
PROTEIN: NEGATIVE
RBC / HPF: NONE SEEN /HPF (ref 0–2)
Specific Gravity, Urine: 1.023 (ref 1.001–1.03)
Squamous Epithelial / LPF: NONE SEEN /HPF (ref <=5)
WBC UA: NONE SEEN /HPF (ref 0–5)

## 2016-12-07 LAB — NO CULTURE INDICATED

## 2016-12-08 LAB — PAP, TP IMAGING W/ HPV RNA, RFLX HPV TYPE 16,18/45: HPV DNA High Risk: NOT DETECTED

## 2017-01-02 ENCOUNTER — Ambulatory Visit
Admission: RE | Admit: 2017-01-02 | Discharge: 2017-01-02 | Disposition: A | Discharge status: Home / Self Care | Payer: BC Managed Care – PPO | Source: Ambulatory Visit | Attending: Women's Health | Admitting: Women's Health

## 2017-01-02 DIAGNOSIS — Z139 Encounter for screening, unspecified: Secondary | ICD-10-CM

## 2017-02-02 ENCOUNTER — Other Ambulatory Visit: Payer: Self-pay | Admitting: Family

## 2017-02-02 DIAGNOSIS — E119 Type 2 diabetes mellitus without complications: Secondary | ICD-10-CM

## 2017-04-18 ENCOUNTER — Other Ambulatory Visit: Payer: Self-pay | Admitting: Nurse Practitioner

## 2017-04-18 NOTE — Telephone Encounter (Signed)
Copied from CRM [id number]. Topic: General - Other >> Apr 18, 2017  2:05 PM Cecelia ByarsGreen, Temeka L, RMA wrote: Reason for CRM: Medication refill request for metFORMIN (GLUCOPHAGE) 1000 MG  and lisinopril-hydrochlorothiazide (PRINZIDE,ZESTORETIC) 20-12.5 MG to be sent to [facility] [place]

## 2017-04-19 ENCOUNTER — Other Ambulatory Visit: Payer: Self-pay

## 2017-04-19 DIAGNOSIS — E119 Type 2 diabetes mellitus without complications: Secondary | ICD-10-CM

## 2017-04-19 DIAGNOSIS — I10 Essential (primary) hypertension: Secondary | ICD-10-CM

## 2017-04-19 MED ORDER — LISINOPRIL-HYDROCHLOROTHIAZIDE 20-12.5 MG PO TABS: 1.0000 | ORAL_TABLET | Freq: Every day | ORAL | 0 refills | Status: DC

## 2017-04-19 MED ORDER — METFORMIN HCL 1000 MG PO TABS: 1000.0000 mg | ORAL_TABLET | Freq: Two times a day (BID) | ORAL | 0 refills | Status: DC

## 2017-07-19 ENCOUNTER — Encounter: Payer: Self-pay | Admitting: Family

## 2017-07-19 ENCOUNTER — Other Ambulatory Visit (INDEPENDENT_AMBULATORY_CARE_PROVIDER_SITE_OTHER): Payer: BC Managed Care – PPO

## 2017-07-19 ENCOUNTER — Ambulatory Visit: Payer: BC Managed Care – PPO | Admitting: Family

## 2017-07-19 ENCOUNTER — Other Ambulatory Visit: Payer: Self-pay | Admitting: Family

## 2017-07-19 VITALS — BP 150/72 | HR 91 | Temp 98.3°F | Ht 68.0 in | Wt 262.0 lb

## 2017-07-19 DIAGNOSIS — I10 Essential (primary) hypertension: Secondary | ICD-10-CM

## 2017-07-19 DIAGNOSIS — R200 Anesthesia of skin: Secondary | ICD-10-CM

## 2017-07-19 DIAGNOSIS — J301 Allergic rhinitis due to pollen: Secondary | ICD-10-CM | POA: Diagnosis not present

## 2017-07-19 DIAGNOSIS — E119 Type 2 diabetes mellitus without complications: Secondary | ICD-10-CM

## 2017-07-19 LAB — COMPREHENSIVE METABOLIC PANEL
ALBUMIN: 3.6 g/dL (ref 3.5–5.2)
ALT: 17 U/L (ref 0–35)
AST: 15 U/L (ref 0–37)
Alkaline Phosphatase: 76 U/L (ref 39–117)
BUN: 8 mg/dL (ref 6–23)
CHLORIDE: 102 meq/L (ref 96–112)
CO2: 26 mEq/L (ref 19–32)
Calcium: 8.9 mg/dL (ref 8.4–10.5)
Creatinine, Ser: 0.49 mg/dL (ref 0.40–1.20)
GFR: 175.95 mL/min (ref >60.00)
Glucose, Bld: 321 mg/dL — ABNORMAL HIGH (ref 70–99)
POTASSIUM: 3.9 meq/L (ref 3.5–5.1)
Sodium: 136 mEq/L (ref 135–145)
Total Bilirubin: 0.4 mg/dL (ref 0.2–1.2)
Total Protein: 7.2 g/dL (ref 6.0–8.3)

## 2017-07-19 LAB — CBC WITH DIFFERENTIAL/PLATELET
BASOS PCT: 1.4 % (ref 0.0–3.0)
Basophils Absolute: 0.1 10*3/uL (ref 0.0–0.1)
EOS PCT: 2.1 % (ref 0.0–5.0)
Eosinophils Absolute: 0.2 10*3/uL (ref 0.0–0.7)
HCT: 33.7 % — ABNORMAL LOW (ref 36.0–46.0)
HEMOGLOBIN: 11.5 g/dL — AB (ref 12.0–15.0)
Lymphocytes Relative: 31.9 % (ref 12.0–46.0)
Lymphs Abs: 3.1 10*3/uL (ref 0.7–4.0)
MCHC: 34.3 g/dL (ref 30.0–36.0)
MCV: 74.7 fl — AB (ref 78.0–100.0)
MONO ABS: 0.8 10*3/uL (ref 0.1–1.0)
MONOS PCT: 8.4 % (ref 3.0–12.0)
Neutro Abs: 5.4 10*3/uL (ref 1.4–7.7)
Neutrophils Relative %: 56.2 % (ref 43.0–77.0)
Platelets: 327 10*3/uL (ref 150.0–400.0)
RBC: 4.51 Mil/uL (ref 3.87–5.11)
RDW: 19.7 % — AB (ref 11.5–15.5)
WBC: 9.7 10*3/uL (ref 4.0–10.5)

## 2017-07-19 LAB — MAGNESIUM: MAGNESIUM: 1.8 mg/dL (ref 1.5–2.5)

## 2017-07-19 LAB — VITAMIN B12: VITAMIN B 12: 882 pg/mL (ref 211–911)

## 2017-07-19 MED ORDER — LISINOPRIL-HYDROCHLOROTHIAZIDE 20-12.5 MG PO TABS: 1.0000 | ORAL_TABLET | Freq: Every day | ORAL | 0 refills | Status: DC

## 2017-07-19 MED ORDER — BLOOD GLUCOSE MONITOR KIT: PACK | 0 refills | Status: DC

## 2017-07-19 MED ORDER — MELOXICAM 7.5 MG PO TABS: 7.5000 mg | ORAL_TABLET | Freq: Every day | ORAL | 0 refills | Status: DC

## 2017-07-19 MED ORDER — LEVOCETIRIZINE DIHYDROCHLORIDE 5 MG PO TABS: 5.0000 mg | ORAL_TABLET | Freq: Every evening | ORAL | 3 refills | Status: DC

## 2017-07-19 NOTE — Progress Notes (Signed)
Samantha Ayers is a 47 y.o. female with the following history as recorded in EpicCare:  Patient Active Problem List   Diagnosis Date Noted  . Encounter for general adult medical examination with abnormal findings 11/23/2016  . Obesity (BMI 30-39.9) 09/27/2016  . Plantar fasciitis 09/22/2016  . Atypical chest pain 06/06/2016  . Seasonal allergic rhinitis due to pollen 06/06/2016  . Body aches 12/25/2015  . Vaginal itching 12/08/2015  . Type 2 diabetes mellitus ([place]) 08/10/2015  . Essential hypertension 08/10/2015  . Acute pain of right knee 08/10/2015  . Achilles tendinitis 08/10/2015    Current Outpatient Medications  Medication Sig Dispense Refill  . Blood Glucose Monitoring Suppl (ONE TOUCH ULTRA MINI) w/Device KIT Use meter to check blood sugars 1-4 times daily as instructed. 1 each 0  . Diclofenac Sodium (PENNSAID) 2 % SOLN Place 1 application onto the skin 2 (two) times daily. 1 Bottle 1  . glucose blood (ONE TOUCH ULTRA TEST) test strip Use one strip per test. Test blood sugars 1-4 times daily as instructed. 100 each 12  . Lancets Misc. (ONE TOUCH SURESOFT) MISC Use 1 lancet per test. Test blood sugars 1-4 times per day as instructed. 1 each 1  . levocetirizine (XYZAL) 5 MG tablet Take 1 tablet (5 mg total) by mouth every evening. 90 tablet 3  . lisinopril-hydrochlorothiazide (PRINZIDE,ZESTORETIC) 20-12.5 MG tablet Take 1 tablet by mouth daily. 90 tablet 0  . meloxicam (MOBIC) 7.5 MG tablet Take 1 tablet (7.5 mg total) by mouth daily. 30 tablet 0  . metFORMIN (GLUCOPHAGE) 1000 MG tablet Take 1 tablet (1,000 mg total) by mouth 2 (two) times daily with a meal. 180 tablet 0  . blood glucose meter kit and supplies KIT Dispense based on patient and insurance preference. 1 each 0   No current facility-administered medications for this visit.     Allergies: Patient has no known allergies.  Past Medical History:  Diagnosis Date  . Allergy   . Asthma   . Diabetes mellitus   .  Hypertension   . Obesity     History reviewed. No pertinent surgical history.  Family History  Problem Relation Age of Onset  . Diabetes Mother   . Diabetes Father     Social History   Tobacco Use  . Smoking status: Never Smoker  . Smokeless tobacco: Never Used  Substance Use Topics  . Alcohol use: No    Subjective:  Patient presents with concerns for intermittent "tingling" in her fingertips and feet for the past week; denies any injury or trauma to her extremities; admits she is not taking her Metformin as prescribed- may only be taking once per day; does not check her blood sugar regularly; Denies any chest pain, shortness of breath, blurred vision or headache.  Very difficult to get complete history on patient this morning as she spent majority of visit on her personal phone;    Objective:  Vitals:   07/19/17 1007  BP: (!) 150/72  Pulse: 91  Temp: 98.3 F (36.8 C)  TempSrc: Oral  SpO2: 98%  Weight: 262 lb (118.8 kg)  Height: _0  (1.727 m)    General: Well developed, well nourished, in no acute distress  Skin : Warm and dry.  Head: Normocephalic and atraumatic  Lungs: Respirations unlabored; clear to auscultation bilaterally without wheeze, rales, rhonchi  CVS exam: normal rate and regular rhythm.  Neurologic: Alert and oriented; speech intact; face symmetrical; moves all extremities well; CNII-XII intact without focal deficit  Assessment:  1. Type 2 diabetes mellitus without complication, without long-term current use of insulin ([place])   2. Numbness   3. Essential hypertension   4. Seasonal allergic rhinitis due to pollen     Plan:  1. Suspect uncontrolled and probably source of symptoms that she is describing today; update labs; will most likely need to adjust medications; follow-up to be determined; 3. ? Control; ? If she is taking medication daily as prescribed; refill updated; 4. Refill on Zyrtec as requested.   No follow-ups on file.  Orders Placed This  Encounter  Procedures  . CBC w/Diff    Standing Status:   Future    Number of Occurrences:   1    Standing Expiration Date:   07/19/2018  . Comp Met (CMET)    Standing Status:   Future    Number of Occurrences:   1    Standing Expiration Date:   07/19/2018  . HgB A1c    Standing Status:   Future    Number of Occurrences:   1    Standing Expiration Date:   07/19/2018  . B12    Standing Status:   Future    Number of Occurrences:   1    Standing Expiration Date:   07/19/2018  . Magnesium    Standing Status:   Future    Number of Occurrences:   1    Standing Expiration Date:   07/19/2018    Requested Prescriptions   Signed Prescriptions Disp Refills  . blood glucose meter kit and supplies KIT 1 each 0    Sig: Dispense based on patient and insurance preference.  . meloxicam (MOBIC) 7.5 MG tablet 30 tablet 0    Sig: Take 1 tablet (7.5 mg total) by mouth daily.  Marland Kitchen lisinopril-hydrochlorothiazide (PRINZIDE,ZESTORETIC) 20-12.5 MG tablet 90 tablet 0    Sig: Take 1 tablet by mouth daily.  Marland Kitchen levocetirizine (XYZAL) 5 MG tablet 90 tablet 3    Sig: Take 1 tablet (5 mg total) by mouth every evening.

## 2017-07-20 LAB — HEMOGLOBIN A1C
HEMOGLOBIN A1C: 10.5 %{Hb} — AB (ref <5.7)
Mean Plasma Glucose: 255 (calc)
eAG (mmol/L): 14.1 (calc)

## 2017-07-24 ENCOUNTER — Other Ambulatory Visit: Payer: Self-pay | Admitting: Family

## 2017-07-24 DIAGNOSIS — E119 Type 2 diabetes mellitus without complications: Secondary | ICD-10-CM

## 2017-07-24 MED ORDER — METFORMIN HCL 1000 MG PO TABS: 1000.0000 mg | ORAL_TABLET | Freq: Two times a day (BID) | ORAL | 0 refills | Status: DC

## 2017-07-24 MED ORDER — DULAGLUTIDE 0.75 MG/0.5ML ~~LOC~~ SOAJ: SUBCUTANEOUS | 1 refills | Status: DC

## 2017-07-24 NOTE — Progress Notes (Signed)
LVM for patient to call back and make appt  °

## 2017-09-20 ENCOUNTER — Telehealth: Payer: Self-pay | Admitting: Nurse Practitioner

## 2017-09-20 MED ORDER — DULAGLUTIDE 0.75 MG/0.5ML ~~LOC~~ SOAJ: SUBCUTANEOUS | 0 refills | Status: DC

## 2017-09-20 NOTE — Telephone Encounter (Signed)
Trulicity refill Last Refill:07/24/17 # 4 pens; RF x 1 Last OV: 07/24/17 PCP: Alphonse GuildAshleigh Shambley Pharmacy: [facility] @ [address]  Per result note of 07/24/17, pt. Was to f/u in 4 weeks.  Phone call to pt.  Advised she will need to f/u with Alphonse GuildAshleigh Shambley to have Diabetes reevaluated since starting Trulicity.  Stated she tried to sched. F/u appt. With Ashleigh, but she was on vacation.  Pt. req. a late appt. Due to her work schedule.  Appt. given for 09/28/17 @ 4:00 PM.  Advised will give enough Trulicity to get by until her appt.  Verb. Understanding and agrees with plan.   Pt. Is also requesting Rx for Ibuprofen 800 mg.  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 Denied leg pain; reported that  only her feet hurt.  Advised will send message to PCP for review of this request.

## 2017-09-20 NOTE — Telephone Encounter (Signed)
Copied from CRM [id number]. Topic: Quick Communication - Rx Refill/Question >> Sep 20, 2017  4:01 PM Darletta MollLander, Samantha Ayers wrote: Medication: Dulaglutide (TRULICITY) 0.75 MG/0.5ML SOPN , ibuprofen 800mg  (out of scripts)  Has the patient contacted their pharmacy? Yes.   (Agent: If no, request that the patient contact the pharmacy for the refill.) (Agent: If yes, when and what did the pharmacy advise?)  Preferred Pharmacy (with phone number or street name): [facility] [address], [place] - [id number] [place] [address] Phone: [phone] Fax: [phone]  Agent: Please be advised that RX refills may take up to 3 business days. We ask that you follow-up with your pharmacy.

## 2017-09-21 MED ORDER — IBUPROFEN 800 MG PO TABS: 800.0000 mg | ORAL_TABLET | Freq: Three times a day (TID) | ORAL | 0 refills | Status: DC | PRN

## 2017-09-21 NOTE — Telephone Encounter (Signed)
Ibuprofen sent to pharmacy. Pt is aware.

## 2017-09-21 NOTE — Addendum Note (Signed)
Addended by: Mercer PodWRENN, Annaliese Saez E on: 09/21/2017 09:30 AM   Modules accepted: Orders

## 2017-09-28 ENCOUNTER — Encounter: Payer: Self-pay | Admitting: Nurse Practitioner

## 2017-09-28 ENCOUNTER — Ambulatory Visit: Payer: BC Managed Care – PPO | Admitting: Nurse Practitioner

## 2017-09-28 VITALS — BP 120/70 | HR 98 | Temp 98.3°F | Resp 16 | Wt 262.0 lb

## 2017-09-28 DIAGNOSIS — E119 Type 2 diabetes mellitus without complications: Secondary | ICD-10-CM

## 2017-09-28 DIAGNOSIS — Z9114 Patient's other noncompliance with medication regimen: Secondary | ICD-10-CM

## 2017-09-28 MED ORDER — BLOOD GLUCOSE METER KIT
PACK | 0 refills | Status: DC
Start: 2017-09-28 — End: 2018-07-10

## 2017-09-28 NOTE — Assessment & Plan Note (Addendum)
Continue current medications Discussed the importance of daily medication compliance and the role of healthy diet and exercise in the management of diabetes, additional information provided on AVS Her daughters here with her today say they are going to commit to reminding her to take her medications daily New glucometer Rx sent RTC in 1 month for F/U- repeat A1c

## 2017-09-28 NOTE — Progress Notes (Addendum)
Name: Samantha Ayers   MRN: [id number]    DOB: 1971/01/15   Date:09/28/2017       Progress Note  Subjective  Chief Complaint  Follow up  HPI  [name] is here today for diabetes follow up, accompanied by her daughters. She has been maintained on maintained on  Metformin 1000 BID for some time and   trulicity 0.01 weekly was added in June for elevated A1c. She was asked to RTC in about 1 month to see how she was doing on new medication, returns today for follow up visit. She tells me today that she has started the trulicity, tolerating well, has not noted any adverse effects but does admit sometimes she forgets to take it. She also says she sometimes forgets her evening metformin dose, just due to being too tired and ready to go to sleep. She reports she feels well today, denies any complaints.  She Denies tremor, diaphoresis, polyuria, polydipsia, polyphagia. She says she would like check her blood sugars but could not afford the last meter that was sent.  Lab Results  Component Value Date   HGBA1C 10.5 (H) 07/19/2017    Patient Active Problem List   Diagnosis Date Noted  . Encounter for general adult medical examination with abnormal findings 11/23/2016  . Obesity (BMI 30-39.9) 09/27/2016  . Plantar fasciitis 09/22/2016  . Atypical chest pain 06/06/2016  . Seasonal allergic rhinitis due to pollen 06/06/2016  . Body aches 12/25/2015  . Vaginal itching 12/08/2015  . Type 2 diabetes mellitus ([place]) 08/10/2015  . Essential hypertension 08/10/2015  . Acute pain of right knee 08/10/2015  . Achilles tendinitis 08/10/2015    No past surgical history on file.  Family History  Problem Relation Age of Onset  . Diabetes Mother   . Diabetes Father     Social History   Socioeconomic History  . Marital status: Married    Spouse name: Not on file  . Number of children: 3  . Years of education: 41  . Highest education level: Not on file  Occupational History  . Occupation:  Actuary  . Occupation: other  Social Needs  . Financial resource strain: Not on file  . Food insecurity:    Worry: Not on file    Inability: Not on file  . Transportation needs:    Medical: Not on file    Non-medical: Not on file  Tobacco Use  . Smoking status: Never Smoker  . Smokeless tobacco: Never Used  Substance and Sexual Activity  . Alcohol use: No  . Drug use: No  . Sexual activity: Not Currently    Partners: Male    Birth control/protection: IUD    Comment: regular periods , intercourse age 43, less than 5 sexual partners  Lifestyle  . Physical activity:    Days per week: Not on file    Minutes per session: Not on file  . Stress: Not on file  Relationships  . Social connections:    Talks on phone: Not on file    Gets together: Not on file    Attends religious service: Not on file    Active member of club or organization: Not on file    Attends meetings of clubs or organizations: Not on file    Relationship status: Not on file  . Intimate partner violence:    Fear of current or ex partner: Not on file    Emotionally abused: Not on file    Physically abused: Not on file  Forced sexual activity: Not on file  Other Topics Concern  . Not on file  Social History Narrative   Married. Immigrated from [place] 16 years ago.   Speaks English well and actively learning more.   She has 3 children which she care for alone right now, husband has returned to [place].   She works 2 jobs and active in her children's activities.   Denies abuse and feels safe at home.    Enjoys cooking.     Current Outpatient Medications:  .  blood glucose meter kit and supplies KIT, Dispense based on patient and insurance preference., Disp: 1 each, Rfl: 0 .  Blood Glucose Monitoring Suppl (ONE TOUCH ULTRA MINI) w/Device KIT, Use meter to check blood sugars 1-4 times daily as instructed., Disp: 1 each, Rfl: 0 .  Diclofenac Sodium (PENNSAID) 2 % SOLN, Place 1 application onto the skin 2  (two) times daily., Disp: 1 Bottle, Rfl: 1 .  Dulaglutide (TRULICITY) 1.32 GM/0.1UU SOPN, Inject once a week as directed;, Disp: 3 pen, Rfl: 0 .  glucose blood (ONE TOUCH ULTRA TEST) test strip, Use one strip per test. Test blood sugars 1-4 times daily as instructed., Disp: 100 each, Rfl: 12 .  ibuprofen (ADVIL,MOTRIN) 800 MG tablet, Take 1 tablet (800 mg total) by mouth every 8 (eight) hours as needed., Disp: 30 tablet, Rfl: 0 .  Lancets Misc. (ONE TOUCH SURESOFT) MISC, Use 1 lancet per test. Test blood sugars 1-4 times per day as instructed., Disp: 1 each, Rfl: 1 .  levocetirizine (XYZAL) 5 MG tablet, Take 1 tablet (5 mg total) by mouth every evening., Disp: 90 tablet, Rfl: 3 .  lisinopril-hydrochlorothiazide (PRINZIDE,ZESTORETIC) 20-12.5 MG tablet, Take 1 tablet by mouth daily., Disp: 90 tablet, Rfl: 0 .  meloxicam (MOBIC) 7.5 MG tablet, Take 1 tablet (7.5 mg total) by mouth daily., Disp: 30 tablet, Rfl: 0 .  metFORMIN (GLUCOPHAGE) 1000 MG tablet, Take 1 tablet (1,000 mg total) by mouth 2 (two) times daily with a meal., Disp: 180 tablet, Rfl: 0  No Known Allergies   ROS See HPI  Objective  Vitals:   09/28/17 1620  BP: 120/70  Pulse: 98  Resp: 16  Temp: 98.3 F (36.8 C)  TempSrc: Oral  SpO2: 98%  Weight: 262 lb (118.8 kg)    Body mass index is 39.84 kg/m.  Physical Exam Vital signs reviewed. Constitutional: Patient appears well-developed and well-nourished. No distress.  HENT: Head: Normocephalic and atraumatic. Nose: Nose normal. Mouth/Throat: Oropharynx is clear and moist. No oropharyngeal exudate.  Eyes: Conjunctivae and EOM are normal. Pupils are equal, round, and reactive to light. No scleral icterus.  Neck: Normal range of motion. Neck supple.  Cardiovascular: Normal rate, regular rhythm and normal heart sounds.  No murmur heard. Distal pulses intact. Pulmonary/Chest: Effort normal and breath sounds normal. No respiratory distress. Neurological: She is alert and  oriented to person, place, and time. No cranial nerve deficit. Coordination, balance, strength, speech and gait are normal.  Skin: Skin is warm and dry. No rash noted. No erythema.  Psychiatric: Patient has a normal mood and affect. behavior is normal. Judgment and thought content normal.    Assessment & Plan RTC in 1 month for F/U: DM-recheck A1c   2. Patient's other noncompliance with medication regimen Discussed the importance of daily medication compliance in the management of diabetes, additional information provided on AVS She was encouraged to take all medications daily as prescribed Her daughters here with her today say they are going  to commit to reminding her to take her medications daily RTC in 1 month for F/U

## 2017-09-28 NOTE — Patient Instructions (Signed)
Please take your diabetes medications daily as instructed so we can get control of your diabetes!  Also, healthy diet and exercise.  Please record your blood sugars in a log twice daily, once in the morning when you first wake up up before eating anything and then again 2 hours after dinner or before bedtime. We need to get your blood sugars under 150 at all times  Please return in about 1 month for follow up, to repeat your A1c   Diabetes Mellitus and Nutrition When you have diabetes (diabetes mellitus), it is very important to have healthy eating habits because your blood sugar (glucose) levels are greatly affected by what you eat and drink. Eating healthy foods in the appropriate amounts, at about the same times every day, can help you:  Control your blood glucose.  Lower your risk of heart disease.  Improve your blood pressure.  Reach or maintain a healthy weight.  Every person with diabetes is different, and each person has different needs for a meal plan. Your health care provider may recommend that you work with a diet and nutrition specialist (dietitian) to make a meal plan that is best for you. Your meal plan may vary depending on factors such as:  The calories you need.  The medicines you take.  Your weight.  Your blood glucose, blood pressure, and cholesterol levels.  Your activity level.  Other health conditions you have, such as heart or kidney disease.  How do carbohydrates affect me? Carbohydrates affect your blood glucose level more than any other type of food. Eating carbohydrates naturally increases the amount of glucose in your blood. Carbohydrate counting is a method for keeping track of how many carbohydrates you eat. Counting carbohydrates is important to keep your blood glucose at a healthy level, especially if you use insulin or take certain oral diabetes medicines. It is important to know how many carbohydrates you can safely have in each meal. This is  different for every person. Your dietitian can help you calculate how many carbohydrates you should have at each meal and for snack. Foods that contain carbohydrates include:  Bread, cereal, rice, pasta, and crackers.  Potatoes and corn.  Peas, beans, and lentils.  Milk and yogurt.  Fruit and juice.  Desserts, such as cakes, cookies, ice cream, and candy.  How does alcohol affect me? Alcohol can cause a sudden decrease in blood glucose (hypoglycemia), especially if you use insulin or take certain oral diabetes medicines. Hypoglycemia can be a life-threatening condition. Symptoms of hypoglycemia (sleepiness, dizziness, and confusion) are similar to symptoms of having too much alcohol. If your health care provider says that alcohol is safe for you, follow these guidelines:  Limit alcohol intake to no more than 1 drink per day for nonpregnant women and 2 drinks per day for men. One drink equals 12 oz of beer, 5 oz of wine, or 1 oz of hard liquor.  Do not drink on an empty stomach.  Keep yourself hydrated with water, diet soda, or unsweetened iced tea.  Keep in mind that regular soda, juice, and other mixers may contain a lot of sugar and must be counted as carbohydrates.  What are tips for following this plan? Reading food labels  Start by checking the serving size on the label. The amount of calories, carbohydrates, fats, and other nutrients listed on the label are based on one serving of the food. Many foods contain more than one serving per package.  Check the total grams (g)  of carbohydrates in one serving. You can calculate the number of servings of carbohydrates in one serving by dividing the total carbohydrates by 15. For example, if a food has 30 g of total carbohydrates, it would be equal to 2 servings of carbohydrates.  Check the number of grams (g) of saturated and trans fats in one serving. Choose foods that have low or no amount of these fats.  Check the number of  milligrams (mg) of sodium in one serving. Most people should limit total sodium intake to less than 2,300 mg per day.  Always check the nutrition information of foods labeled as "low-fat" or "nonfat". These foods may be higher in added sugar or refined carbohydrates and should be avoided.  Talk to your dietitian to identify your daily goals for nutrients listed on the label. Shopping  Avoid buying canned, premade, or processed foods. These foods tend to be high in fat, sodium, and added sugar.  Shop around the outside edge of the grocery store. This includes fresh fruits and vegetables, bulk grains, fresh meats, and fresh dairy. Cooking  Use low-heat cooking methods, such as baking, instead of high-heat cooking methods like deep frying.  Cook using healthy oils, such as olive, canola, or sunflower oil.  Avoid cooking with butter, cream, or high-fat meats. Meal planning  Eat meals and snacks regularly, preferably at the same times every day. Avoid going long periods of time without eating.  Eat foods high in fiber, such as fresh fruits, vegetables, beans, and whole grains. Talk to your dietitian about how many servings of carbohydrates you can eat at each meal.  Eat 4-6 ounces of lean protein each day, such as lean meat, chicken, fish, eggs, or tofu. 1 ounce is equal to 1 ounce of meat, chicken, or fish, 1 egg, or 1/4 cup of tofu.  Eat some foods each day that contain healthy fats, such as avocado, nuts, seeds, and fish. Lifestyle   Check your blood glucose regularly.  Exercise at least 30 minutes 5 or more days each week, or as told by your health care provider.  Take medicines as told by your health care provider.  Do not use any products that contain nicotine or tobacco, such as cigarettes and e-cigarettes. If you need help quitting, ask your health care provider.  Work with a Veterinary surgeoncounselor or diabetes educator to identify strategies to manage stress and any emotional and social  challenges. What are some questions to ask my health care provider?  Do I need to meet with a diabetes educator?  Do I need to meet with a dietitian?  What number can I call if I have questions?  When are the best times to check my blood glucose? Where to find more information:  American Diabetes Association: diabetes.org/food-and-fitness/food  Academy of Nutrition and Dietetics: https://www.vargas.com/www.eatright.org/resources/health/diseases-and-conditions/diabetes  [employer] Institute of Diabetes and Digestive and Kidney Diseases (NIH): FindJewelers.czwww.niddk.nih.gov/health-information/diabetes/overview/diet-eating-physical-activity Summary  A healthy meal plan will help you control your blood glucose and maintain a healthy lifestyle.  Working with a diet and nutrition specialist (dietitian) can help you make a meal plan that is best for you.  Keep in mind that carbohydrates and alcohol have immediate effects on your blood glucose levels. It is important to count carbohydrates and to use alcohol carefully. This information is not intended to replace advice given to you by your health care provider. Make sure you discuss any questions you have with your health care provider. Document Released: 10/21/2004 Document Revised: 02/29/2016 Document Reviewed: 02/29/2016 Elsevier  Interactive Patient Education  [employer].

## 2017-10-24 ENCOUNTER — Other Ambulatory Visit: Payer: Self-pay | Admitting: Nurse Practitioner

## 2017-10-24 ENCOUNTER — Other Ambulatory Visit: Payer: Self-pay | Admitting: Family

## 2017-10-24 DIAGNOSIS — E119 Type 2 diabetes mellitus without complications: Secondary | ICD-10-CM

## 2017-10-24 NOTE — Telephone Encounter (Signed)
Scheduled to see you tomorrow; I will let you decide about refills then.

## 2017-10-25 ENCOUNTER — Ambulatory Visit: Payer: BC Managed Care – PPO | Admitting: Nurse Practitioner

## 2017-10-25 ENCOUNTER — Encounter: Payer: Self-pay | Admitting: Nurse Practitioner

## 2017-10-25 VITALS — BP 120/82 | HR 84 | Ht 68.0 in | Wt 259.0 lb

## 2017-10-25 DIAGNOSIS — I10 Essential (primary) hypertension: Secondary | ICD-10-CM | POA: Diagnosis not present

## 2017-10-25 DIAGNOSIS — R200 Anesthesia of skin: Secondary | ICD-10-CM | POA: Diagnosis not present

## 2017-10-25 DIAGNOSIS — E119 Type 2 diabetes mellitus without complications: Secondary | ICD-10-CM

## 2017-10-25 DIAGNOSIS — M79671 Pain in right foot: Secondary | ICD-10-CM

## 2017-10-25 DIAGNOSIS — Z23 Encounter for immunization: Secondary | ICD-10-CM

## 2017-10-25 DIAGNOSIS — J301 Allergic rhinitis due to pollen: Secondary | ICD-10-CM

## 2017-10-25 DIAGNOSIS — M79672 Pain in left foot: Secondary | ICD-10-CM

## 2017-10-25 LAB — POCT GLYCOSYLATED HEMOGLOBIN (HGB A1C): HEMOGLOBIN A1C: 8.5 % — AB (ref 4.0–5.6)

## 2017-10-25 MED ORDER — LEVOCETIRIZINE DIHYDROCHLORIDE 5 MG PO TABS: 5.0000 mg | ORAL_TABLET | Freq: Every evening | ORAL | 3 refills | Status: DC

## 2017-10-25 MED ORDER — IBUPROFEN 800 MG PO TABS: 800.0000 mg | ORAL_TABLET | Freq: Three times a day (TID) | ORAL | 0 refills | Status: DC | PRN

## 2017-10-25 MED ORDER — LISINOPRIL-HYDROCHLOROTHIAZIDE 20-12.5 MG PO TABS: 1.0000 | ORAL_TABLET | Freq: Every day | ORAL | 0 refills | Status: DC

## 2017-10-25 MED ORDER — METFORMIN HCL 1000 MG PO TABS: 1000.0000 mg | ORAL_TABLET | Freq: Two times a day (BID) | ORAL | 0 refills | Status: DC

## 2017-10-25 NOTE — Assessment & Plan Note (Signed)
Stable Continue current medications - lisinopril-hydrochlorothiazide (PRINZIDE,ZESTORETIC) 20-12.5 MG tablet; Take 1 tablet by mouth daily.  Dispense: 90 tablet; Refill: 0

## 2017-10-25 NOTE — Assessment & Plan Note (Signed)
Stable Continue current medications F/u for new, worsening symptoms - levocetirizine (XYZAL) 5 MG tablet; Take 1 tablet (5 mg total) by mouth every evening.  Dispense: 90 tablet; Refill: 3

## 2017-10-25 NOTE — Assessment & Plan Note (Signed)
A1c is trending down with addition of trulicity and improved medication compliance, however she does report some continued non-compliance so no medication adjustments will be made today We again discussed the importance of daily medication compliance in the optimal management  Of diabetes and she was encouraged to maintain daily med complinace RTC in 3 months for F/U-repeat A1c Additional education provided on AVS - metFORMIN (GLUCOPHAGE) 1000 MG tablet; Take 1 tablet (1,000 mg total) by mouth 2 (two) times daily with a meal.  Dispense: 180 tablet; Refill: 0 - POCT glycosylated hemoglobin (Hb A1C) Lab Results  Component Value Date   HGBA1C 8.5 (A) 10/25/2017

## 2017-10-25 NOTE — Patient Instructions (Addendum)
Your A1c has greatly improved, from 10.5 to 8.5 Please continue to take your medications EVERY DAY Please return in about 3 months for follow up If your A1c remains above 7 while taking your medications daily, we will adjust your medications at next office visit.  Please schedule a follow up appointment for further evaluation of your foot pain and finger numbness here with Dr Katrinka BlazingSmith or Dr Jordan LikesSchmitz, our sports medicine providers.  I will plan to see you back in 3 months.   Diabetes Mellitus and Nutrition When you have diabetes (diabetes mellitus), it is very important to have healthy eating habits because your blood sugar (glucose) levels are greatly affected by what you eat and drink. Eating healthy foods in the appropriate amounts, at about the same times every day, can help you:  Control your blood glucose.  Lower your risk of heart disease.  Improve your blood pressure.  Reach or maintain a healthy weight.  Every person with diabetes is different, and each person has different needs for a meal plan. Your health care provider may recommend that you work with a diet and nutrition specialist (dietitian) to make a meal plan that is best for you. Your meal plan may vary depending on factors such as:  The calories you need.  The medicines you take.  Your weight.  Your blood glucose, blood pressure, and cholesterol levels.  Your activity level.  Other health conditions you have, such as heart or kidney disease.  How do carbohydrates affect me? Carbohydrates affect your blood glucose level more than any other type of food. Eating carbohydrates naturally increases the amount of glucose in your blood. Carbohydrate counting is a method for keeping track of how many carbohydrates you eat. Counting carbohydrates is important to keep your blood glucose at a healthy level, especially if you use insulin or take certain oral diabetes medicines. It is important to know how many carbohydrates you  can safely have in each meal. This is different for every person. Your dietitian can help you calculate how many carbohydrates you should have at each meal and for snack. Foods that contain carbohydrates include:  Bread, cereal, rice, pasta, and crackers.  Potatoes and corn.  Peas, beans, and lentils.  Milk and yogurt.  Fruit and juice.  Desserts, such as cakes, cookies, ice cream, and candy.  How does alcohol affect me? Alcohol can cause a sudden decrease in blood glucose (hypoglycemia), especially if you use insulin or take certain oral diabetes medicines. Hypoglycemia can be a life-threatening condition. Symptoms of hypoglycemia (sleepiness, dizziness, and confusion) are similar to symptoms of having too much alcohol. If your health care provider says that alcohol is safe for you, follow these guidelines:  Limit alcohol intake to no more than 1 drink per day for nonpregnant women and 2 drinks per day for men. One drink equals 12 oz of beer, 5 oz of wine, or 1 oz of hard liquor.  Do not drink on an empty stomach.  Keep yourself hydrated with water, diet soda, or unsweetened iced tea.  Keep in mind that regular soda, juice, and other mixers may contain a lot of sugar and must be counted as carbohydrates.  What are tips for following this plan? Reading food labels  Start by checking the serving size on the label. The amount of calories, carbohydrates, fats, and other nutrients listed on the label are based on one serving of the food. Many foods contain more than one serving per package.  Check the  total grams (g) of carbohydrates in one serving. You can calculate the number of servings of carbohydrates in one serving by dividing the total carbohydrates by 15. For example, if a food has 30 g of total carbohydrates, it would be equal to 2 servings of carbohydrates.  Check the number of grams (g) of saturated and trans fats in one serving. Choose foods that have low or no amount of  these fats.  Check the number of milligrams (mg) of sodium in one serving. Most people should limit total sodium intake to less than 2,300 mg per day.  Always check the nutrition information of foods labeled as "low-fat" or "nonfat". These foods may be higher in added sugar or refined carbohydrates and should be avoided.  Talk to your dietitian to identify your daily goals for nutrients listed on the label. Shopping  Avoid buying canned, premade, or processed foods. These foods tend to be high in fat, sodium, and added sugar.  Shop around the outside edge of the grocery store. This includes fresh fruits and vegetables, bulk grains, fresh meats, and fresh dairy. Cooking  Use low-heat cooking methods, such as baking, instead of high-heat cooking methods like deep frying.  Cook using healthy oils, such as olive, canola, or sunflower oil.  Avoid cooking with butter, cream, or high-fat meats. Meal planning  Eat meals and snacks regularly, preferably at the same times every day. Avoid going long periods of time without eating.  Eat foods high in fiber, such as fresh fruits, vegetables, beans, and whole grains. Talk to your dietitian about how many servings of carbohydrates you can eat at each meal.  Eat 4-6 ounces of lean protein each day, such as lean meat, chicken, fish, eggs, or tofu. 1 ounce is equal to 1 ounce of meat, chicken, or fish, 1 egg, or 1/4 cup of tofu.  Eat some foods each day that contain healthy fats, such as avocado, nuts, seeds, and fish. Lifestyle   Check your blood glucose regularly.  Exercise at least 30 minutes 5 or more days each week, or as told by your health care provider.  Take medicines as told by your health care provider.  Do not use any products that contain nicotine or tobacco, such as cigarettes and e-cigarettes. If you need help quitting, ask your health care provider.  Work with a Veterinary surgeon or diabetes educator to identify strategies to manage  stress and any emotional and social challenges. What are some questions to ask my health care provider?  Do I need to meet with a diabetes educator?  Do I need to meet with a dietitian?  What number can I call if I have questions?  When are the best times to check my blood glucose? Where to find more information:  American Diabetes Association: diabetes.org/food-and-fitness/food  Academy of Nutrition and Dietetics: https://www.vargas.com/  [employer] of Diabetes and Digestive and Kidney Diseases (NIH): FindJewelers.cz Summary  A healthy meal plan will help you control your blood glucose and maintain a healthy lifestyle.  Working with a diet and nutrition specialist (dietitian) can help you make a meal plan that is best for you.  Keep in mind that carbohydrates and alcohol have immediate effects on your blood glucose levels. It is important to count carbohydrates and to use alcohol carefully. This information is not intended to replace advice given to you by your health care provider. Make sure you discuss any questions you have with your health care provider. Document Released: 10/21/2004 Document Revised: 02/29/2016 Document  Reviewed: 02/29/2016 Elsevier Interactive Patient Education  [employer].

## 2017-10-25 NOTE — Progress Notes (Signed)
Name: Samantha Ayers   MRN: [id number]    DOB: [birth year]   Date:10/25/2017       Progress Note  Subjective  Chief Complaint  Chief Complaint  Patient presents with  . Leg Pain    Bilateral  . Numbness    Right 3rd finger x 2 months    HPI Ms Samantha Ayers is here today for diabetes follow up. She is also requesting evaluation of right middle finger numbness and bilateral foot pain.  Diabetes- maintained on  Metformin 1000 BID for some time and trulicity 3.08 weekly was added in June for elevated A1c At her last OV on 09/28/17, she did tell me she often forgot to take her medication, and we discussed the importance of daily med compliance. She returns today for follow up, tells me she has done much better taking her medications since last OV, only forgot about 1-2 doses over the entire last month. Reports she has started checking her blood sugars most days now, sometimes am fasting sometimes evening before bed with readings over past month 120s-205. Denies tremor, diaphoresis, polyuria, polydipsia, polyphagia.  Lab Results  Component Value Date   HGBA1C 10.5 (H) 07/19/2017   Foot pain- This is not a new problem, has been occurring since at least last year She c/o bilateral foot pain, throbbing aching pain, to the bottoms of both of her feet, notices the pain at the end of the day or while at work on her feet. Sometimes her feet look swollen. Denies fevers, leg pain, skin discoloration. Tries massage and pennsaid cream with some relief She saw Samantha Ayers for this pain last year but did not follow back up  Finger numbness- This is not a problem She c/o intermittent numb, tingling sensation to her right middle finger that occurs intermittently, not daily, maybe a few times a week, ongoing since last summer She denies weakness, pain, swelling. She denies numbness in other parts of her body.  Patient Active Problem List   Diagnosis Date Noted  . Encounter for general adult medical examination  with abnormal findings 11/23/2016  . Obesity (BMI 30-39.9) 09/27/2016  . Plantar fasciitis 09/22/2016  . Atypical chest pain 06/06/2016  . Seasonal allergic rhinitis due to pollen 06/06/2016  . Body aches 12/25/2015  . Vaginal itching 12/08/2015  . Type 2 diabetes mellitus ([place]) 08/10/2015  . Essential hypertension 08/10/2015  . Acute pain of right knee 08/10/2015  . Achilles tendinitis 08/10/2015    Social History   Tobacco Use  . Smoking status: Never Smoker  . Smokeless tobacco: Never Used  Substance Use Topics  . Alcohol use: No     Current Outpatient Medications:  .  ACCU-CHEK FASTCLIX LANCETS MISC, USE TO CHECK BLOOD SUGARS ONCE DAILY, Disp: , Rfl: 0 .  blood glucose meter kit and supplies KIT, Dispense based on patient and insurance preference., Disp: 1 each, Rfl: 0 .  blood glucose meter kit and supplies, Dispense based on patient and insurance preference. Use to check blood sugar daily., Disp: 1 each, Rfl: 0 .  Blood Glucose Monitoring Suppl (ONE TOUCH ULTRA MINI) w/Device KIT, Use meter to check blood sugars 1-4 times daily as instructed., Disp: 1 each, Rfl: 0 .  Diclofenac Sodium (PENNSAID) 2 % SOLN, Place 1 application onto the skin 2 (two) times daily., Disp: 1 Bottle, Rfl: 1 .  glucose blood (ONE TOUCH ULTRA TEST) test strip, Use one strip per test. Test blood sugars 1-4 times daily as instructed., Disp: 100 each, Rfl: 12 .  ibuprofen (ADVIL,MOTRIN) 800 MG tablet, Take 1 tablet (800 mg total) by mouth every 8 (eight) hours as needed., Disp: 30 tablet, Rfl: 0 .  Lancets Misc. (ONE TOUCH SURESOFT) MISC, Use 1 lancet per test. Test blood sugars 1-4 times per day as instructed., Disp: 1 each, Rfl: 1 .  levocetirizine (XYZAL) 5 MG tablet, Take 1 tablet (5 mg total) by mouth every evening., Disp: 90 tablet, Rfl: 3 .  lisinopril-hydrochlorothiazide (PRINZIDE,ZESTORETIC) 20-12.5 MG tablet, Take 1 tablet by mouth daily., Disp: 90 tablet, Rfl: 0 .  meloxicam (MOBIC) 7.5 MG  tablet, Take 1 tablet (7.5 mg total) by mouth daily., Disp: 30 tablet, Rfl: 0 .  metFORMIN (GLUCOPHAGE) 1000 MG tablet, Take 1 tablet (1,000 mg total) by mouth 2 (two) times daily with a meal., Disp: 180 tablet, Rfl: 0 .  TRULICITY 3.33 OV/2.9VB SOPN, INJECT 1  SUBCUTANEOUSLY ONCE A WEEK AS DIRECTED** NEED OFFICE VISIT BEFORE FURTHER REFILLS**, Disp: 4 pen, Rfl: 0  No Known Allergies  ROS  No other specific complaints in a complete review of systems (except as listed in HPI above).  Objective  Vitals:   10/25/17 1009  BP: 120/82  Pulse: 84  SpO2: 99%  Weight: 259 lb (117.5 kg)  Height: 5' 8"  (1.727 m)    Body mass index is 39.38 kg/m.  Nursing Note and Vital Signs reviewed.  Physical Exam Vital signs reviewed. Constitutional: Patient appears well-developed and well-nourished. No distress.  HENT: Head: Normocephalic and atraumatic. Nose: Nose normal. Mouth/Throat: Oropharynx is clear and moist. No oropharyngeal exudate.  Eyes: Conjunctivae and EOM are normal. Pupils are equal, round, and reactive to light. No scleral icterus.  Neck: Normal range of motion. Neck supple.  Cardiovascular: Normal rate, regular rhythm and normal heart sounds.  No murmur heard. Distal pulses intact. Pulmonary/Chest: Effort normal and breath sounds normal. No respiratory distress. Musculoskeletal: Normal range of motion, No gross deformities Neurological: She is alert and oriented to person, place, and time. No cranial nerve deficit. Coordination, balance, strength, speech and gait are normal.  Skin: Skin is warm and dry. No rash noted. No erythema.  Psychiatric: Patient has a normal mood and affect. behavior is normal. Judgment and thought content normal.  Assessment & Plan RTC in 3 months for F/U: DM- repeat A1c; CPE if stable  -Reviewed Health Maintenance: Need for influenza vaccination- Flu Vaccine QUAD 36+ mos IM  Numbness Recent b12, mg done 07/19/17 WNL Could be related to uncontrolled  diabetes but symptoms are only occurring in 1 finger so I am going to have her schedule F/U with Samantha Ayers for further evaluation  Pain in both feet Per chart review, It looks like she was following with Samantha Ayers provider last year for plantar fascitits, achilles tendinitis with instructions to F/U for persistent foot pain, I have instructed her to schedule Samantha Ayers F/U for further evaluation She is requesting ibuprofen refill today, 1 refill given with instructions to take PRN only - ibuprofen (ADVIL,MOTRIN) 800 MG tablet; Take 1 tablet (800 mg total) by mouth every 8 (eight) hours as needed.  Dispense: 30 tablet; Refill: 0

## 2017-10-27 ENCOUNTER — Ambulatory Visit: Payer: BC Managed Care – PPO | Admitting: Family Medicine

## 2017-10-27 ENCOUNTER — Other Ambulatory Visit (INDEPENDENT_AMBULATORY_CARE_PROVIDER_SITE_OTHER): Payer: BC Managed Care – PPO

## 2017-10-27 ENCOUNTER — Ambulatory Visit (INDEPENDENT_AMBULATORY_CARE_PROVIDER_SITE_OTHER)
Admission: RE | Admit: 2017-10-27 | Discharge: 2017-10-27 | Disposition: A | Discharge status: Home / Self Care | Payer: BC Managed Care – PPO | Source: Ambulatory Visit | Attending: Family Medicine | Admitting: Family Medicine

## 2017-10-27 ENCOUNTER — Encounter: Payer: Self-pay | Admitting: Family Medicine

## 2017-10-27 ENCOUNTER — Ambulatory Visit: Payer: BC Managed Care – PPO | Admitting: Nurse Practitioner

## 2017-10-27 VITALS — BP 122/68 | HR 101 | Ht 68.0 in | Wt 239.0 lb

## 2017-10-27 DIAGNOSIS — M7661 Achilles tendinitis, right leg: Secondary | ICD-10-CM

## 2017-10-27 DIAGNOSIS — M7662 Achilles tendinitis, left leg: Secondary | ICD-10-CM

## 2017-10-27 LAB — C-REACTIVE PROTEIN: CRP: 0.5 mg/dL (ref 0.5–20.0)

## 2017-10-27 LAB — SEDIMENTATION RATE: Sed Rate: 30 mm/hr — ABNORMAL HIGH (ref 0–20)

## 2017-10-27 MED ORDER — NITROGLYCERIN 0.2 MG/HR TD PT24: MEDICATED_PATCH | TRANSDERMAL | 11 refills | Status: DC

## 2017-10-27 NOTE — Assessment & Plan Note (Signed)
Has had ongoing pain at the insertion of the Achilles.  Has had minimal improvement with prior treatment.  Is taken ibuprofen on a daily basis to get through. -Nitro. -CRP and ESR. -X-rays bilaterally. -Counseled on heel lifts. -Follow-up in 4 to 6 weeks to scan.

## 2017-10-27 NOTE — Patient Instructions (Addendum)
Good to see you  I will call you with the results from today  Please try to get the heel lifts and the midfoot arch strap  Please follow up with me in 4-6 weeks.

## 2017-10-27 NOTE — Progress Notes (Signed)
Samantha Ayers - 47 y.o. female MRN [id number]  Date of birth: 05/21/1970  SUBJECTIVE:  Including CC & ROS.  Chief Complaint  Patient presents with  . Foot Pain    Samantha Ayers is a 47 y.o. female that is presenting foot pain. Pain is in her both of her feet. Located in the . Admits to swelling and tenderness. Pain is mild to severe during dorsiflexion. She notices the pain more when she is standing. She has changed her shoes. She takes Motrin for the pain. Denies injury or surgeries.     Review of Systems  Constitutional: Negative for fever.  HENT: Negative for congestion.   Respiratory: Negative for cough.   Cardiovascular: Negative for chest pain.  Gastrointestinal: Negative for abdominal pain.  Musculoskeletal: Negative for gait problem.  Skin: Negative for color change.  Neurological: Negative for weakness.  Hematological: Negative for adenopathy.  Psychiatric/Behavioral: Negative for agitation.    HISTORY: Past Medical, Surgical, Social, and Family History Reviewed & Updated per EMR.   Pertinent Historical Findings include:  Past Medical History:  Diagnosis Date  . Allergy   . Asthma   . Diabetes mellitus   . Hypertension   . Obesity     No past surgical history on file.  No Known Allergies  Family History  Problem Relation Age of Onset  . Diabetes Mother   . Diabetes Father      Social History   Socioeconomic History  . Marital status: Married    Spouse name: Not on file  . Number of children: 3  . Years of education: 74  . Highest education level: Not on file  Occupational History  . Occupation: Actuary  . Occupation: other  Social Needs  . Financial resource strain: Not on file  . Food insecurity:    Worry: Not on file    Inability: Not on file  . Transportation needs:    Medical: Not on file    Non-medical: Not on file  Tobacco Use  . Smoking status: Never Smoker  . Smokeless tobacco: Never Used  Substance and Sexual Activity    . Alcohol use: No  . Drug use: No  . Sexual activity: Not Currently    Partners: Male    Birth control/protection: IUD    Comment: regular periods , intercourse age 40, less than 5 sexual partners  Lifestyle  . Physical activity:    Days per week: Not on file    Minutes per session: Not on file  . Stress: Not on file  Relationships  . Social connections:    Talks on phone: Not on file    Gets together: Not on file    Attends religious service: Not on file    Active member of club or organization: Not on file    Attends meetings of clubs or organizations: Not on file    Relationship status: Not on file  . Intimate partner violence:    Fear of current or ex partner: Not on file    Emotionally abused: Not on file    Physically abused: Not on file    Forced sexual activity: Not on file  Other Topics Concern  . Not on file  Social History Narrative   Married. Immigrated from [place] 16 years ago.   Speaks English well and actively learning more.   She has 3 children which she care for alone right now, husband has returned to [place].   She works 2 jobs and active in her children's  activities.   Denies abuse and feels safe at home.    Enjoys cooking.     PHYSICAL EXAM:  VS: BP 122/68   Pulse (!) 101   Ht _0  (1.727 m)   Wt 239 lb (108.4 kg)   LMP 10/16/2017 (Approximate)   SpO2 99%   BMI 36.34 kg/m  Physical Exam Gen: NAD, alert, cooperative with exam, well-appearing ENT: normal lips, normal nasal mucosa,  Eye: normal EOM, normal conjunctiva and lids CV:  no edema, +2 pedal pulses   Resp: no accessory muscle use, non-labored,  Skin: no rashes, no areas of induration  Neuro: normal tone, normal sensation to touch Psych:  normal insight, alert and oriented MSK:  Right and Left foot:  Tender to palpation bilaterally at the insertion of the Achilles. No Haglund's deformity. Tender to palpation at the plantar aspect of the calcaneus bilaterally. Normal range of  motion. Normal strength resistance. Neurovascularly intact     ASSESSMENT & PLAN:   Achilles tendinitis Has had ongoing pain at the insertion of the Achilles.  Has had minimal improvement with prior treatment.  Is taken ibuprofen on a daily basis to get through. -Nitro. -CRP and ESR. -X-rays bilaterally. -Counseled on heel lifts. -Follow-up in 4 to 6 weeks to scan.

## 2017-10-30 ENCOUNTER — Telehealth: Payer: Self-pay | Admitting: Family Medicine

## 2017-10-30 NOTE — Telephone Encounter (Signed)
Spoke with patient about xrays and labs. Could benefit from custom orthotics.   Myra RudeSchmitz, Jeremy E, MD [facility] 10/30/2017, 10:00 AM

## 2017-11-22 ENCOUNTER — Other Ambulatory Visit: Payer: Self-pay | Admitting: Nurse Practitioner

## 2017-11-22 MED ORDER — DULAGLUTIDE 0.75 MG/0.5ML ~~LOC~~ SOAJ: SUBCUTANEOUS | 2 refills | Status: DC

## 2017-11-22 NOTE — Telephone Encounter (Signed)
Copied from CRM [id number]. Topic: Quick Communication - Rx Refill/Question >> Nov 22, 2017 11:38 AM Herby Abraham C wrote: Medication: TRULICITY 0.75 MG/0.5ML SOPN   Has the patient contacted their pharmacy? No  (Agent: If no, request that the patient contact the pharmacy for the refill.) (Agent: If yes, when and what did the pharmacy advise?)  Preferred Pharmacy (with phone number or street name): [facility] [place], [place] - [address] [phone] (Phone) [phone] (Fax)    Agent: Please be advised that RX refills may take up to 3 business days. We ask that you follow-up with your pharmacy.

## 2017-12-18 ENCOUNTER — Other Ambulatory Visit: Payer: Self-pay | Admitting: Nurse Practitioner

## 2017-12-18 DIAGNOSIS — M79672 Pain in left foot: Principal | ICD-10-CM

## 2017-12-18 DIAGNOSIS — M79671 Pain in right foot: Secondary | ICD-10-CM

## 2017-12-18 DIAGNOSIS — I10 Essential (primary) hypertension: Secondary | ICD-10-CM

## 2018-01-12 ENCOUNTER — Other Ambulatory Visit: Payer: Self-pay | Admitting: Women's Health

## 2018-01-12 DIAGNOSIS — Z1231 Encounter for screening mammogram for malignant neoplasm of breast: Secondary | ICD-10-CM

## 2018-01-25 ENCOUNTER — Encounter: Payer: Self-pay | Admitting: Nurse Practitioner

## 2018-01-25 ENCOUNTER — Ambulatory Visit: Payer: BC Managed Care – PPO | Admitting: Nurse Practitioner

## 2018-01-25 VITALS — BP 120/70 | HR 101 | Ht 68.0 in | Wt 266.0 lb

## 2018-01-25 DIAGNOSIS — E119 Type 2 diabetes mellitus without complications: Secondary | ICD-10-CM | POA: Diagnosis not present

## 2018-01-25 LAB — POCT GLYCOSYLATED HEMOGLOBIN (HGB A1C): HEMOGLOBIN A1C: 9.4 % — AB (ref 4.0–5.6)

## 2018-01-25 MED ORDER — DULAGLUTIDE 1.5 MG/0.5ML ~~LOC~~ SOAJ: 1.5000 mg | SUBCUTANEOUS | 1 refills | Status: DC

## 2018-01-25 NOTE — Assessment & Plan Note (Signed)
A1c is higher today, will increase trulicity and continue metformin at current dosage May need to add 3rd agent but we will see how her A1c looks at 3 month follow up, she has just now become compliant with the 2  Medication regimen We also Discussed the role of healthy diet and exercise in the management of diabetes, and provided additional information AVS  Lab Results  Component Value Date   HGBA1C 9.4 (A) 01/25/2018

## 2018-01-25 NOTE — Progress Notes (Signed)
Samantha Ayers is a 47 y.o. female with the following history as recorded in [facility]:  Patient Active Problem List   Diagnosis Date Noted  . Encounter for general adult medical examination with abnormal findings 11/23/2016  . Obesity (BMI 30-39.9) 09/27/2016  . Plantar fasciitis 09/22/2016  . Atypical chest pain 06/06/2016  . Seasonal allergic rhinitis due to pollen 06/06/2016  . Body aches 12/25/2015  . Vaginal itching 12/08/2015  . Type 2 diabetes mellitus ([place]) 08/10/2015  . Essential hypertension 08/10/2015  . Acute pain of right knee 08/10/2015  . Achilles tendinitis 08/10/2015    Current Outpatient Medications  Medication Sig Dispense Refill  . ACCU-CHEK FASTCLIX LANCETS MISC USE TO CHECK BLOOD SUGARS ONCE DAILY  0  . blood glucose meter kit and supplies KIT Dispense based on patient and insurance preference. 1 each 0  . blood glucose meter kit and supplies Dispense based on patient and insurance preference. Use to check blood sugar daily. 1 each 0  . Blood Glucose Monitoring Suppl (ONE TOUCH ULTRA MINI) w/Device KIT Use meter to check blood sugars 1-4 times daily as instructed. 1 each 0  . Diclofenac Sodium (PENNSAID) 2 % SOLN Place 1 application onto the skin 2 (two) times daily. 1 Bottle 1  . glucose blood (ONE TOUCH ULTRA TEST) test strip Use one strip per test. Test blood sugars 1-4 times daily as instructed. 100 each 12  . ibuprofen (ADVIL,MOTRIN) 800 MG tablet TAKE 1 TABLET BY MOUTH EVERY 8 HOURS AS NEEDED 30 tablet 0  . Lancets Misc. (ONE TOUCH SURESOFT) MISC Use 1 lancet per test. Test blood sugars 1-4 times per day as instructed. 1 each 1  . levocetirizine (XYZAL) 5 MG tablet Take 1 tablet (5 mg total) by mouth every evening. 90 tablet 3  . lisinopril-hydrochlorothiazide (PRINZIDE,ZESTORETIC) 20-12.5 MG tablet TAKE 1 TABLET BY MOUTH  DAILY 90 tablet 0  . meloxicam (MOBIC) 7.5 MG tablet Take 1 tablet (7.5 mg total) by mouth daily. 30 tablet 0  . metFORMIN (GLUCOPHAGE)  1000 MG tablet Take 1 tablet (1,000 mg total) by mouth 2 (two) times daily with a meal. 180 tablet 0  . nitroGLYCERIN (NITRODUR - DOSED IN MG/24 HR) 0.2 mg/hr patch Cut and apply 1/4 patch to most painful area q24h. 30 patch 11  . Dulaglutide (TRULICITY) 1.5 XT/0.6YI SOPN Inject 1.5 mg into the skin once a week. 4 pen 1   No current facility-administered medications for this visit.     Allergies: Patient has no known allergies.  Past Medical History:  Diagnosis Date  . Allergy   . Asthma   . Diabetes mellitus   . Hypertension   . Obesity     History reviewed. No pertinent surgical history.  Family History  Problem Relation Age of Onset  . Diabetes Mother   . Diabetes Father     Social History   Tobacco Use  . Smoking status: Never Smoker  . Smokeless tobacco: Never Used  Substance Use Topics  . Alcohol use: No     Subjective:  Samantha Ayers is here today for diabetes follow up, maintained on Metformin 9485 BID, and trulicity 4.62 weekly was added around June. At her last OV on  10/25/17, her A1c was still elevated but improved from June A1c and medication noncompliance was noted so no medication changes were made, but compliance was discussed and she was asked to RTC in 3 months for follow up. She Is back today for this follow up , tells me  that with the help of her daughters she has been taking all medications daily as prescribed.  She occasionally checks glucose readings at home with recent readings ranging from 100-200 She eats three meals a day. She reports polydipsia. Denies tremor, diaphoresis, polyuria,  polyphagia.  Lab Results  Component Value Date   HGBA1C 9.4 (A) 01/25/2018    ROS- See HPI  Objective:  Vitals:   01/25/18 1121  BP: 120/70  Pulse: (!) 101  SpO2: 98%  Weight: 266 lb (120.7 kg)  Height: 5' 8" (1.727 m)    General: Well developed, well nourished, in no acute distress  Skin : Warm and dry.  Head: Normocephalic and atraumatic  Eyes: Sclera and  conjunctiva clear; pupils round and reactive to light; extraocular movements intact  Oropharynx: Pink, supple. No suspicious lesions  Neck: Supple Lungs: Respirations unlabored; clear to auscultation bilaterally without wheeze, rales, rhonchi  CVS exam: normal rate, regular rhythm, normal S1, S2, no murmurs, rubs, clicks or gallops.  Extremities: No edema, cyanosis, clubbing  Vessels: Symmetric bilaterally  Neurologic: Alert and oriented; speech intact; face symmetrical; moves all extremities well; CNII-XII intact without focal deficit  Psychiatric: Normal mood and affect.  Physical Exam   Assessment:  1. Type 2 diabetes mellitus without complication, without long-term current use of insulin ([place])     Plan:   Return in about 3 months (around 04/26/2018) for F/U- DM- increasing trulicity, recheck F3L; CPE if stable.  Orders Placed This Encounter  Procedures  . POCT glycosylated hemoglobin (Hb A1C)    Requested Prescriptions   Signed Prescriptions Disp Refills  . Dulaglutide (TRULICITY) 1.5 KT/6.2BW SOPN 4 pen 1    Sig: Inject 1.5 mg into the skin once a week.

## 2018-01-25 NOTE — Patient Instructions (Addendum)
Increase trulicity to 1.5 mg once weekly  Continue metformin  Work on healthy diet and exercise!   Diabetes Mellitus and Nutrition, Adult When you have diabetes (diabetes mellitus), it is very important to have healthy eating habits because your blood sugar (glucose) levels are greatly affected by what you eat and drink. Eating healthy foods in the appropriate amounts, at about the same times every day, can help you:  Control your blood glucose.  Lower your risk of heart disease.  Improve your blood pressure.  Reach or maintain a healthy weight. Every person with diabetes is different, and each person has different needs for a meal plan. Your health care provider may recommend that you work with a diet and nutrition specialist (dietitian) to make a meal plan that is best for you. Your meal plan may vary depending on factors such as:  The calories you need.  The medicines you take.  Your weight.  Your blood glucose, blood pressure, and cholesterol levels.  Your activity level.  Other health conditions you have, such as heart or kidney disease. How do carbohydrates affect me? Carbohydrates, also called carbs, affect your blood glucose level more than any other type of food. Eating carbs naturally raises the amount of glucose in your blood. Carb counting is a method for keeping track of how many carbs you eat. Counting carbs is important to keep your blood glucose at a healthy level, especially if you use insulin or take certain oral diabetes medicines. It is important to know how many carbs you can safely have in each meal. This is different for every person. Your dietitian can help you calculate how many carbs you should have at each meal and for each snack. Foods that contain carbs include:  Bread, cereal, rice, pasta, and crackers.  Potatoes and corn.  Peas, beans, and lentils.  Milk and yogurt.  Fruit and juice.  Desserts, such as cakes, cookies, ice cream, and  candy. How does alcohol affect me? Alcohol can cause a sudden decrease in blood glucose (hypoglycemia), especially if you use insulin or take certain oral diabetes medicines. Hypoglycemia can be a life-threatening condition. Symptoms of hypoglycemia (sleepiness, dizziness, and confusion) are similar to symptoms of having too much alcohol. If your health care provider says that alcohol is safe for you, follow these guidelines:  Limit alcohol intake to no more than 1 drink per day for nonpregnant women and 2 drinks per day for men. One drink equals 12 oz of beer, 5 oz of wine, or 1 oz of hard liquor.  Do not drink on an empty stomach.  Keep yourself hydrated with water, diet soda, or unsweetened iced tea.  Keep in mind that regular soda, juice, and other mixers may contain a lot of sugar and must be counted as carbs. What are tips for following this plan?  Reading food labels  Start by checking the serving size on the "Nutrition Facts" label of packaged foods and drinks. The amount of calories, carbs, fats, and other nutrients listed on the label is based on one serving of the item. Many items contain more than one serving per package.  Check the total grams (g) of carbs in one serving. You can calculate the number of servings of carbs in one serving by dividing the total carbs by 15. For example, if a food has 30 g of total carbs, it would be equal to 2 servings of carbs.  Check the number of grams (g) of saturated and trans fats  in one serving. Choose foods that have low or no amount of these fats.  Check the number of milligrams (mg) of salt (sodium) in one serving. Most people should limit total sodium intake to less than 2,300 mg per day.  Always check the nutrition information of foods labeled as "low-fat" or "nonfat". These foods may be higher in added sugar or refined carbs and should be avoided.  Talk to your dietitian to identify your daily goals for nutrients listed on the  label. Shopping  Avoid buying canned, premade, or processed foods. These foods tend to be high in fat, sodium, and added sugar.  Shop around the outside edge of the grocery store. This includes fresh fruits and vegetables, bulk grains, fresh meats, and fresh dairy. Cooking  Use low-heat cooking methods, such as baking, instead of high-heat cooking methods like deep frying.  Cook using healthy oils, such as olive, canola, or sunflower oil.  Avoid cooking with butter, cream, or high-fat meats. Meal planning  Eat meals and snacks regularly, preferably at the same times every day. Avoid going long periods of time without eating.  Eat foods high in fiber, such as fresh fruits, vegetables, beans, and whole grains. Talk to your dietitian about how many servings of carbs you can eat at each meal.  Eat 4-6 ounces (oz) of lean protein each day, such as lean meat, chicken, fish, eggs, or tofu. One oz of lean protein is equal to: ? 1 oz of meat, chicken, or fish. ? 1 egg. ?  cup of tofu.  Eat some foods each day that contain healthy fats, such as avocado, nuts, seeds, and fish. Lifestyle  Check your blood glucose regularly.  Exercise regularly as told by your health care provider. This may include: ? 150 minutes of moderate-intensity or vigorous-intensity exercise each week. This could be brisk walking, biking, or water aerobics. ? Stretching and doing strength exercises, such as yoga or weightlifting, at least 2 times a week.  Take medicines as told by your health care provider.  Do not use any products that contain nicotine or tobacco, such as cigarettes and e-cigarettes. If you need help quitting, ask your health care provider.  Work with a Social worker or diabetes educator to identify strategies to manage stress and any emotional and social challenges. Questions to ask a health care provider  Do I need to meet with a diabetes educator?  Do I need to meet with a dietitian?  What  number can I call if I have questions?  When are the best times to check my blood glucose? Where to find more information:  American Diabetes Association: diabetes.org  Academy of Nutrition and Dietetics: www.eatright.[employer] of Diabetes and Digestive and Kidney Diseases (NIH): [employer] Summary  A healthy meal plan will help you control your blood glucose and maintain a healthy lifestyle.  Working with a diet and nutrition specialist (dietitian) can help you make a meal plan that is best for you.  Keep in mind that carbohydrates (carbs) and alcohol have immediate effects on your blood glucose levels. It is important to count carbs and to use alcohol carefully. This information is not intended to replace advice given to you by your health care provider. Make sure you discuss any questions you have with your health care provider. Document Released: 10/21/2004 Document Revised: 08/24/2016 Document Reviewed: 02/29/2016 Elsevier Interactive Patient Education  2019 [employer].

## 2018-02-26 ENCOUNTER — Ambulatory Visit
Admission: RE | Admit: 2018-02-26 | Discharge: 2018-02-26 | Disposition: A | Discharge status: Home / Self Care | Payer: BC Managed Care – PPO | Source: Ambulatory Visit | Attending: Women's Health | Admitting: Women's Health

## 2018-02-26 DIAGNOSIS — Z1231 Encounter for screening mammogram for malignant neoplasm of breast: Secondary | ICD-10-CM

## 2018-02-27 NOTE — Progress Notes (Signed)
[name] [address] [place], [place] [address] Phone: [phone] Subjective:   Fontaine No, am serving as a scribe for Dr. Hulan Saas.   CC: ankle pain   KGY:JEHUDJSHFW  Samantha Ayers is a 48 y.o. female coming in with complaint of chronic bilateral achilles tendon pain. Pain at base of heels. Has been using nitro patches which has minimally helped her pain. Notes that the nitro patches are making her skin peel off. Has also tried stretching the heel cord and ice to that area. States that her pain increases with weight bearing activities. 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        Past Medical History:  Diagnosis Date  . Allergy   . Asthma   . Diabetes mellitus   . Hypertension   . Obesity    No past surgical history on file. Social History   Socioeconomic History  . Marital status: Married    Spouse name: Not on file  . Number of children: 3  . Years of education: 63  . Highest education level: Not on file  Occupational History  . Occupation: Actuary  . Occupation: other  Social Needs  . Financial resource strain: Not on file  . Food insecurity:    Worry: Not on file    Inability: Not on file  . Transportation needs:    Medical: Not on file    Non-medical: Not on file  Tobacco Use  . Smoking status: Never Smoker  . Smokeless tobacco: Never Used  Substance and Sexual Activity  . Alcohol use: No  . Drug use: No  . Sexual activity: Not Currently    Partners: Male    Birth control/protection: I.U.D.    Comment: regular periods , intercourse age 89, less than 5 sexual partners  Lifestyle  . Physical activity:    Days per week: Not on file    Minutes per session: Not on file  . Stress: Not on file  Relationships  . Social connections:    Talks on phone: Not on file    Gets together: Not on file    Attends religious service: Not on file    Active member of club or organization: Not  on file    Attends meetings of clubs or organizations: Not on file    Relationship status: Not on file  Other Topics Concern  . Not on file  Social History Narrative   Married. Immigrated from [place] 16 years ago.   Speaks English well and actively learning more.   She has 3 children which she care for alone right now, husband has returned to [place].   She works 2 jobs and active in her children's activities.   Denies abuse and feels safe at home.    Enjoys cooking.   No Known Allergies Family History  Problem Relation Age of Onset  . Diabetes Mother   . Diabetes Father   . Breast cancer Neg Hx     Current Outpatient Medications (Endocrine & Metabolic):  Marland Kitchen  Dulaglutide (TRULICITY) 1.46 YO/3.7CH SOPN, Inject 1.5 mg into the skin once a week. .  metFORMIN (GLUCOPHAGE) 1000 MG tablet, Take 1 tablet (1,000 mg total) by mouth 2 (two) times daily with a meal.  Current Outpatient Medications (Cardiovascular):  .  lisinopril-hydrochlorothiazide (PRINZIDE,ZESTORETIC) 20-12.5 MG tablet, TAKE 1 TABLET BY MOUTH  DAILY .  nitroGLYCERIN (NITRODUR - DOSED IN MG/24 HR) 0.2 mg/hr patch, Cut and apply  1/4 patch to most painful area q24h.  Current Outpatient Medications (Respiratory):  .  levocetirizine (XYZAL) 5 MG tablet, Take 1 tablet (5 mg total) by mouth every evening.  Current Outpatient Medications (Analgesics):  .  ibuprofen (ADVIL,MOTRIN) 800 MG tablet, TAKE 1 TABLET BY MOUTH EVERY 8 HOURS AS NEEDED .  meloxicam (MOBIC) 7.5 MG tablet, Take 1 tablet (7.5 mg total) by mouth daily.   Current Outpatient Medications (Other):  Marland Kitchen  ACCU-CHEK FASTCLIX LANCETS MISC, USE TO CHECK BLOOD SUGARS ONCE DAILY .  blood glucose meter kit and supplies KIT, Dispense based on patient and insurance preference. .  blood glucose meter kit and supplies, Dispense based on patient and insurance preference. Use to check blood sugar daily. .  Blood Glucose Monitoring Suppl (ONE TOUCH ULTRA MINI) w/Device KIT, Use  meter to check blood sugars 1-4 times daily as instructed. .  Diclofenac Sodium (PENNSAID) 2 % SOLN, Place 1 application onto the skin 2 (two) times daily. Marland Kitchen  glucose blood (ONE TOUCH ULTRA TEST) test strip, Use one strip per test. Test blood sugars 1-4 times daily as instructed. .  Lancets Misc. (ONE TOUCH SURESOFT) MISC, Use 1 lancet per test. Test blood sugars 1-4 times per day as instructed.    Past medical history, social, surgical and family history all reviewed in electronic medical record.  No pertanent information unless stated regarding to the chief complaint.   Review of Systems:  No headache, visual changes, nausea, vomiting, diarrhea, constipation, dizziness, abdominal pain, skin rash, fevers, chills, night sweats, weight loss, swollen lymph nodes, body aches, joint swelling,  chest pain, shortness of breath, mood changes.  Positive muscle aches   Objective  Blood pressure 128/78, pulse 90, height '5\' 8"'$  (1.727 m), weight 267 lb (121.1 kg), SpO2 98 %.   General: No apparent distress alert and oriented x3 mood and affect normal, dressed appropriately. Morbidly obese.  HEENT: Pupils equal, extraocular movements intact  Respiratory: Patient's speak in full sentences and does not appear short of breath  Cardiovascular: 1+lower extremity edema, non tender, no erythema  Skin: Warm dry intact with no signs of infection or rash on extremities or on axial skeleton.  Abdomen: Soft nontender  Neuro: Cranial nerves II through XII are intact, neurovascularly intact in all extremities with 2+ DTRs and 2+ pulses.  Lymph: No lymphadenopathy of posterior or anterior cervical chain or axillae bilaterally.  Gait antalgic MSK:  Nn tender with full range of motion and good stability and symmetric strength and tone of shoulders, elbows, wrist, hip, knee bilaterally. Mild OA of multiple joints   Ankle:Bilateral  Trace visible  Swelling more distal achilles. Limited ROM in all planes worse on right    Strength is4/5 in all directions. Stable lateral and medial ligaments; squeeze test and kleiger test unremarkable; Talar dome nontender; No pain at base of 5th MT; No tenderness over cuboid; No tenderness over N spot or navicular prominence No tenderness on posterior aspects of lateral and medial malleolus No sign of peroneal tendon subluxations or tenderness to palpation Pain at insertion of achilles bilaterally right greater than left  Negative tarsal tunnel tinel's Able to walk 4 steps. Severe pes planus bilaterally right greater than left   MSK US performed of: right ankle  This study was ordered, performed, and interpreted by Charlann Boxer D.O.  Foot/Ankle:   All structures visualized.   Talar dome unremarkable  Achilles tendon inter substance tearing increase vascularity   IMPRESSION:  Achilles tendonitis intersubstance tearing.  Impression and Recommendations:     This case required medical decision making of moderate complexity. The above documentation has been reviewed and is accurate and complete Lyndal Pulley, DO       Note: This dictation was prepared with Dragon dictation along with smaller phrase technology. Any transcriptional errors that result from this process are unintentional.

## 2018-02-28 ENCOUNTER — Ambulatory Visit: Payer: BC Managed Care – PPO | Admitting: Family Medicine

## 2018-02-28 ENCOUNTER — Ambulatory Visit: Payer: Self-pay

## 2018-02-28 ENCOUNTER — Encounter: Payer: Self-pay | Admitting: Family Medicine

## 2018-02-28 VITALS — BP 128/78 | HR 90 | Ht 68.0 in | Wt 267.0 lb

## 2018-02-28 DIAGNOSIS — M79672 Pain in left foot: Principal | ICD-10-CM

## 2018-02-28 DIAGNOSIS — M79671 Pain in right foot: Secondary | ICD-10-CM

## 2018-02-28 DIAGNOSIS — M7662 Achilles tendinitis, left leg: Secondary | ICD-10-CM

## 2018-02-28 DIAGNOSIS — M7661 Achilles tendinitis, right leg: Secondary | ICD-10-CM

## 2018-02-28 NOTE — Patient Instructions (Signed)
Good to see you  Ice 20 minutes 2 times daily. Usually after activity and before bed. Spenco orthotics "total support" online would be great  Or a heel lift would be great.  Look at [facility] or [facility] as well would be good.  Avoid being barefoot If getting new shoes Good shoes with rigid bottom.  Dierdre Harness, [place] or New balance greater then 700 Weight loss could help but will take time so do not beat yourself up! pennsaid pinkie amount topically 2 times daily as needed.   Vitamin D 2000 IU daily over the counter can help with muscle strength  See me again in 4 weeks

## 2018-02-28 NOTE — Assessment & Plan Note (Signed)
Patient does have more of an Achilles tendinitis bilaterally.  Discussed with patient about a heel lift, home exercise, topical anti-inflammatories.  We discussed proper shoes.  Encourage weight loss.  Patient can continue nitroglycerin but did have some mild skin changes noted and encourage patient to monitor.  Do not feel that injections would be beneficial.  Due to financial constraints patient states she was unable to do physical therapy.  Follow-up again in 4 weeks

## 2018-03-31 NOTE — Progress Notes (Deleted)
[name], [place] [address] Phone: [phone] Subjective:    I'm seeing this patient by the request  of:    CC: Bilateral ankle pain follow-up  [id number]  Samantha Ayers is a 48 y.o. female coming in with complaint of right ankle pain. Working diagnosis of bilateral Achilles tendinitis r home exercise, icing regimen, proper shoes and over-the-counter orthotics with topical anti-inflammatories.  Patient states     Past Medical History:  Diagnosis Date  . Allergy   . Asthma   . Diabetes mellitus   . Hypertension   . Obesity    No past surgical history on file. Social History   Socioeconomic History  . Marital status: Married    Spouse name: Not on file  . Number of children: 3  . Years of education: 37  . Highest education level: Not on file  Occupational History  . Occupation: Actuary  . Occupation: other  Social Needs  . Financial resource strain: Not on file  . Food insecurity:    Worry: Not on file    Inability: Not on file  . Transportation needs:    Medical: Not on file    Non-medical: Not on file  Tobacco Use  . Smoking status: Never Smoker  . Smokeless tobacco: Never Used  Substance and Sexual Activity  . Alcohol use: No  . Drug use: No  . Sexual activity: Not Currently    Partners: Male    Birth control/protection: I.U.D.    Comment: regular periods , intercourse age 47, less than 5 sexual partners  Lifestyle  . Physical activity:    Days per week: Not on file    Minutes per session: Not on file  . Stress: Not on file  Relationships  . Social connections:    Talks on phone: Not on file    Gets together: Not on file    Attends religious service: Not on file    Active member of club or organization: Not on file    Attends meetings of clubs or organizations: Not on file    Relationship status: Not on file  Other Topics Concern  . Not on file  Social History Narrative   Married. Immigrated from [place] 16 years ago.   Speaks English well and actively learning more.   She has 3 children which she care for alone right now, husband has returned to [place].   She works 2 jobs and active in her children's activities.   Denies abuse and feels safe at home.    Enjoys cooking.   No Known Allergies Family History  Problem Relation Age of Onset  . Diabetes Mother   . Diabetes Father   . Breast cancer Neg Hx     Current Outpatient Medications (Endocrine & Metabolic):  Marland Kitchen  Dulaglutide (TRULICITY) 1.5 NH/6.5BX SOPN, Inject 1.5 mg into the skin once a week. .  metFORMIN (GLUCOPHAGE) 1000 MG tablet, Take 1 tablet (1,000 mg total) by mouth 2 (two) times daily with a meal.  Current Outpatient Medications (Cardiovascular):  .  lisinopril-hydrochlorothiazide (PRINZIDE,ZESTORETIC) 20-12.5 MG tablet, TAKE 1 TABLET BY MOUTH  DAILY .  nitroGLYCERIN (NITRODUR - DOSED IN MG/24 HR) 0.2 mg/hr patch, Cut and apply 1/4 patch to most painful area q24h.  Current Outpatient Medications (Respiratory):  .  levocetirizine (XYZAL) 5 MG tablet, Take 1 tablet (5 mg total) by mouth every evening.  Current Outpatient Medications (Analgesics):  .  ibuprofen (ADVIL,MOTRIN) 800 MG  tablet, TAKE 1 TABLET BY MOUTH EVERY 8 HOURS AS NEEDED .  meloxicam (MOBIC) 7.5 MG tablet, Take 1 tablet (7.5 mg total) by mouth daily.   Current Outpatient Medications (Other):  Marland Kitchen  ACCU-CHEK FASTCLIX LANCETS MISC, USE TO CHECK BLOOD SUGARS ONCE DAILY .  blood glucose meter kit and supplies KIT, Dispense based on patient and insurance preference. .  blood glucose meter kit and supplies, Dispense based on patient and insurance preference. Use to check blood sugar daily. .  Blood Glucose Monitoring Suppl (ONE TOUCH ULTRA MINI) w/Device KIT, Use meter to check blood sugars 1-4 times daily as instructed. .  Diclofenac Sodium (PENNSAID) 2 % SOLN, Place 1 application onto the skin 2 (two) times daily. Marland Kitchen  glucose blood  (ONE TOUCH ULTRA TEST) test strip, Use one strip per test. Test blood sugars 1-4 times daily as instructed. .  Lancets Misc. (ONE TOUCH SURESOFT) MISC, Use 1 lancet per test. Test blood sugars 1-4 times per day as instructed.    Past medical history, social, surgical and family history all reviewed in electronic medical record.  No pertanent information unless stated regarding to the chief complaint.   Review of Systems:  No headache, visual changes, nausea, vomiting, diarrhea, constipation, dizziness, abdominal pain, skin rash, fevers, chills, night sweats, weight loss, swollen lymph nodes, body aches, joint swelling, muscle aches, chest pain, shortness of breath, mood changes.   Objective  There were no vitals taken for this visit. Systems examined below as of    General: No apparent distress alert and oriented x3 mood and affect normal, dressed appropriately.  HEENT: Pupils equal, extraocular movements intact  Respiratory: Patient's speak in full sentences and does not appear short of breath  Cardiovascular: No lower extremity edema, non tender, no erythema  Skin: Warm dry intact with no signs of infection or rash on extremities or on axial skeleton.  Abdomen: Soft nontender  Neuro: Cranial nerves II through XII are intact, neurovascularly intact in all extremities with 2+ DTRs and 2+ pulses.  Lymph: No lymphadenopathy of posterior or anterior cervical chain or axillae bilaterally.  Gait normal with good balance and coordination.  MSK:  Non tender with full range of motion and good stability and symmetric strength and tone of shoulders, elbows, wrist, hip, knee  bilaterally.  Ankle: No visible erythema or swelling. Range of motion is full in all directions. Strength is 5/5 in all directions. Stable lateral and medial ligaments; squeeze test and kleiger test unremarkable; Talar dome nontender; No pain at base of 5th MT; No tenderness over cuboid; No tenderness over N spot or  navicular prominence No tenderness on posterior aspects of lateral and medial malleolus No sign of peroneal tendon subluxations or tenderness to palpation Negative tarsal tunnel tinel's Able to walk 4 steps.   Impression and Recommendations:     This case required medical decision making of moderate complexity. The above documentation has been reviewed and is accurate and complete Lyndal Pulley, DO       Note: This dictation was prepared with Dragon dictation along with smaller phrase technology. Any transcriptional errors that result from this process are unintentional.

## 2018-04-02 ENCOUNTER — Ambulatory Visit: Payer: BC Managed Care – PPO | Admitting: Family Medicine

## 2018-04-20 ENCOUNTER — Other Ambulatory Visit: Payer: Self-pay | Admitting: Nurse Practitioner

## 2018-04-20 DIAGNOSIS — M79671 Pain in right foot: Secondary | ICD-10-CM

## 2018-04-20 DIAGNOSIS — M79672 Pain in left foot: Principal | ICD-10-CM

## 2018-04-20 MED ORDER — IBUPROFEN 800 MG PO TABS: 800.0000 mg | ORAL_TABLET | Freq: Three times a day (TID) | ORAL | 0 refills | Status: DC | PRN

## 2018-04-20 NOTE — Telephone Encounter (Signed)
Copied from CRM [id number]. Topic: Quick Communication - Rx Refill/Question >> Apr 20, 2018  9:07 AM Crist Infante wrote: Medication: ibuprofen (ADVIL,MOTRIN) 800 MG tablet  Pt is having heel pain.  Has appt with Dr Katrinka Blazing on 3/23, but states she really needs something for this pain. Also has appt with Ashleigh on 3/19.  [facility] [address] [place], [place] - [address] [phone] (Phone) [phone] (Fax)

## 2018-04-20 NOTE — Telephone Encounter (Signed)
Requested Prescriptions  Pending Prescriptions Disp Refills  . ibuprofen (ADVIL,MOTRIN) 800 MG tablet 30 tablet 0    Sig: Take 1 tablet (800 mg total) by mouth every 8 (eight) hours as needed.     Analgesics:  NSAIDS Failed - 04/20/2018  9:17 AM      Failed - HGB in normal range and within 360 days    Hemoglobin  Date Value Ref Range Status  07/19/2017 11.5 (L) 12.0 - 15.0 g/dL Final         Passed - Cr in normal range and within 360 days    Creatinine, Ser  Date Value Ref Range Status  07/19/2017 0.49 0.40 - 1.20 mg/dL Final         Passed - Patient is not pregnant      Passed - Valid encounter within last 12 months    Recent Outpatient Visits          1 month ago Heel pain, bilateral   [facility] Antoine Primas M, DO   2 months ago Type 2 diabetes mellitus without complication, without long-term current use of insulin (HCC)   [facility] Aura Camps, Audie Box, NP   5 months ago Achilles tendinitis of both lower extremities   [facility] -[place] Mans, Marye Round, MD   5 months ago Need for influenza vaccination   [facility] -[place] Nephew, Audie Box, NP   6 months ago Type 2 diabetes mellitus without complication, without long-term current use of insulin (HCC)   [facility] Aura Camps, Audie Box, NP      Future Appointments            In 6 days Shambley, Audie Box, NP [facility], [facility]   In 1 week Katrinka Blazing, Clifton Custard, DO [facility], [facility]

## 2018-04-26 ENCOUNTER — Ambulatory Visit: Payer: BC Managed Care – PPO | Admitting: Nurse Practitioner

## 2018-04-26 ENCOUNTER — Encounter: Payer: Self-pay | Admitting: Family Medicine

## 2018-04-26 ENCOUNTER — Encounter: Payer: Self-pay | Admitting: Nurse Practitioner

## 2018-04-26 ENCOUNTER — Ambulatory Visit: Payer: BC Managed Care – PPO | Admitting: Family Medicine

## 2018-04-26 ENCOUNTER — Other Ambulatory Visit: Payer: Self-pay

## 2018-04-26 VITALS — BP 128/70 | HR 92 | Ht 68.0 in | Wt 270.0 lb

## 2018-04-26 DIAGNOSIS — Z23 Encounter for immunization: Secondary | ICD-10-CM | POA: Diagnosis not present

## 2018-04-26 DIAGNOSIS — M7661 Achilles tendinitis, right leg: Secondary | ICD-10-CM | POA: Diagnosis not present

## 2018-04-26 DIAGNOSIS — M7662 Achilles tendinitis, left leg: Secondary | ICD-10-CM

## 2018-04-26 DIAGNOSIS — I10 Essential (primary) hypertension: Secondary | ICD-10-CM

## 2018-04-26 DIAGNOSIS — E119 Type 2 diabetes mellitus without complications: Secondary | ICD-10-CM | POA: Diagnosis not present

## 2018-04-26 LAB — POCT GLYCOSYLATED HEMOGLOBIN (HGB A1C): Hemoglobin A1C: 10.2 % — AB (ref 4.0–5.6)

## 2018-04-26 MED ORDER — DULAGLUTIDE 1.5 MG/0.5ML ~~LOC~~ SOAJ: 1.5000 mg | SUBCUTANEOUS | 1 refills | Status: DC

## 2018-04-26 MED ORDER — METFORMIN HCL 1000 MG PO TABS: 1000.0000 mg | ORAL_TABLET | Freq: Two times a day (BID) | ORAL | 0 refills | Status: DC

## 2018-04-26 MED ORDER — LISINOPRIL-HYDROCHLOROTHIAZIDE 20-12.5 MG PO TABS: 1.0000 | ORAL_TABLET | Freq: Every day | ORAL | 0 refills | Status: DC

## 2018-04-26 NOTE — Assessment & Plan Note (Signed)
Stable Continue current medications Continue to monitor - lisinopril-hydrochlorothiazide (PRINZIDE,ZESTORETIC) 20-12.5 MG tablet; Take 1 tablet by mouth daily.  Dispense: 90 tablet; Refill: 0 - Ambulatory referral to Ophthalmology

## 2018-04-26 NOTE — Assessment & Plan Note (Signed)
Discussed again with patient at length.  Patient denied any type of CAM Walker, unable to afford physical therapy, patient is wearing very poor shoes and we discussed a rigid bottom or even a rocker-bottom shoe that I think will be more beneficial.  Patient has been noncompliant on the heel lift with the home exercises as well.  We discussed the core stability and weight loss that could be beneficial.  Patient given another 2 months and try to make these changes and then further evaluation.  Discussed again I do not feel comfortable with patient's blood sugars as well as injection of a loadbearing tendon at this point would be beneficial for her.  Spent  25 minutes with patient face-to-face and had greater than 50% of counseling including as described above in assessment and plan.

## 2018-04-26 NOTE — Progress Notes (Signed)
Samantha Ayers is a 48 y.o. female with the following history as recorded in EpicCare:  Patient Active Problem List   Diagnosis Date Noted  . Encounter for general adult medical examination with abnormal findings 11/23/2016  . Obesity (BMI 30-39.9) 09/27/2016  . Plantar fasciitis 09/22/2016  . Atypical chest pain 06/06/2016  . Seasonal allergic rhinitis due to pollen 06/06/2016  . Body aches 12/25/2015  . Vaginal itching 12/08/2015  . Type 2 diabetes mellitus ([place]) 08/10/2015  . Essential hypertension 08/10/2015  . Acute pain of right knee 08/10/2015  . Achilles tendinitis 08/10/2015    Current Outpatient Medications  Medication Sig Dispense Refill  . ACCU-CHEK FASTCLIX LANCETS MISC USE TO CHECK BLOOD SUGARS ONCE DAILY  0  . blood glucose meter kit and supplies KIT Dispense based on patient and insurance preference. 1 each 0  . blood glucose meter kit and supplies Dispense based on patient and insurance preference. Use to check blood sugar daily. 1 each 0  . Blood Glucose Monitoring Suppl (ONE TOUCH ULTRA MINI) w/Device KIT Use meter to check blood sugars 1-4 times daily as instructed. 1 each 0  . Diclofenac Sodium (PENNSAID) 2 % SOLN Place 1 application onto the skin 2 (two) times daily. 1 Bottle 1  . Dulaglutide (TRULICITY) 1.5 ZC/5.8IF SOPN Inject 1.5 mg into the skin once a week. 4 pen 1  . glucose blood (ONE TOUCH ULTRA TEST) test strip Use one strip per test. Test blood sugars 1-4 times daily as instructed. 100 each 12  . ibuprofen (ADVIL,MOTRIN) 800 MG tablet Take 1 tablet (800 mg total) by mouth every 8 (eight) hours as needed. 30 tablet 0  . Lancets Misc. (ONE TOUCH SURESOFT) MISC Use 1 lancet per test. Test blood sugars 1-4 times per day as instructed. 1 each 1  . levocetirizine (XYZAL) 5 MG tablet Take 1 tablet (5 mg total) by mouth every evening. 90 tablet 3  . lisinopril-hydrochlorothiazide (PRINZIDE,ZESTORETIC) 20-12.5 MG tablet Take 1 tablet by mouth daily. 90  tablet 0  . meloxicam (MOBIC) 7.5 MG tablet Take 1 tablet (7.5 mg total) by mouth daily. 30 tablet 0  . metFORMIN (GLUCOPHAGE) 1000 MG tablet Take 1 tablet (1,000 mg total) by mouth 2 (two) times daily with a meal. 180 tablet 0  . nitroGLYCERIN (NITRODUR - DOSED IN MG/24 HR) 0.2 mg/hr patch Cut and apply 1/4 patch to most painful area q24h. 30 patch 11   No current facility-administered medications for this visit.     Allergies: Patient has no known allergies.  Past Medical History:  Diagnosis Date  . Allergy   . Asthma   . Diabetes mellitus   . Hypertension   . Obesity     History reviewed. No pertinent surgical history.  Family History  Problem Relation Age of Onset  . Diabetes Mother   . Diabetes Father   . Breast cancer Neg Hx     Social History   Tobacco Use  . Smoking status: Never Smoker  . Smokeless tobacco: Never Used  Substance Use Topics  . Alcohol use: No     Subjective:  Here today for diabetes follow up. Also requesting refill of HTN meds She is overdue for CPE, but says she prefers to hold on CPE for now, does not want to have any labs aside from A1c today and is not fasting anyways, just wants to work on diabetes control for now  Diabetes-  maintained on Metformin 0277 BID, and trulicity was increased to 1.5  once weekly at her last OV on 01/25/2018 due to continued elevated A1c of 9.4 She tells me today that she has increased trulicity dosage but often misses doses of trulicity, also misses her metformin night dosage frequently, says she just does not remember to take meds regularly Does not report any recent glucose readings.  Hypertension -maintained on lisinopril-hydrochlorothiazide 20-12.5 Reports daily medication compliance without adverse medication effects.  BP Readings from Last 3 Encounters:  04/26/18 128/70  02/28/18 128/78  01/25/18 120/70   ROS- See HPI  Objective:  Vitals:   04/26/18 1524  BP: 128/70  Pulse: 92  SpO2: 97%  Weight:  270 lb (122.5 kg)  Height: _0  (1.727 m)    General: Well developed, well nourished, in no acute distress  Skin : Warm and dry.  Head: Normocephalic and atraumatic  Eyes: Sclera and conjunctiva clear; pupils round and reactive to light; extraocular movements intact  Oropharynx: Pink, supple. No suspicious lesions  Neck: Supple Lungs: Effort unlabored, no respiratory distress CVS exam: normal rate and regular rhythm.  Extremities: No edema, cyanosis, clubbing  Vessels: Symmetric bilaterally  Neurologic: Alert and oriented; speech intact; face symmetrical; moves all extremities well; CNII-XII intact without focal deficit  Psychiatric: Normal mood and affect.  Assessment:  1. Type 2 diabetes mellitus without complication, without long-term current use of insulin ([place])   2. Essential hypertension     Plan:   Need for pneumococcal vaccination - Pneumococcal polysaccharide vaccine 23-valent greater than or equal to 2yo subcutaneous/IM  Return in about 3 months (around 07/27/2018) for routine follow up, CPE?/labs.

## 2018-04-26 NOTE — Patient Instructions (Signed)
Good to see you  Heel lift over the counter Look for rocker bottom shoes Good shoes with rigid bottom.  Samantha Ayers, [place] or New balance greater then 700, [place] or even hiking boots are needed Avoidbeing barefoot I would decrease to half a patch  See me again in 8 weeks if not better

## 2018-04-26 NOTE — Patient Instructions (Addendum)
You will be called about referrals to ophthalmologist and endocrinologist.  Please return in about 3 months for routine follow up, could do annual fast labs at next visit.   Diabetes Mellitus and Nutrition, Adult When you have diabetes (diabetes mellitus), it is very important to have healthy eating habits because your blood sugar (glucose) levels are greatly affected by what you eat and drink. Eating healthy foods in the appropriate amounts, at about the same times every day, can help you:  Control your blood glucose.  Lower your risk of heart disease.  Improve your blood pressure.  Reach or maintain a healthy weight. Every person with diabetes is different, and each person has different needs for a meal plan. Your health care provider may recommend that you work with a diet and nutrition specialist (dietitian) to make a meal plan that is best for you. Your meal plan may vary depending on factors such as:  The calories you need.  The medicines you take.  Your weight.  Your blood glucose, blood pressure, and cholesterol levels.  Your activity level.  Other health conditions you have, such as heart or kidney disease. How do carbohydrates affect me? Carbohydrates, also called carbs, affect your blood glucose level more than any other type of food. Eating carbs naturally raises the amount of glucose in your blood. Carb counting is a method for keeping track of how many carbs you eat. Counting carbs is important to keep your blood glucose at a healthy level, especially if you use insulin or take certain oral diabetes medicines. It is important to know how many carbs you can safely have in each meal. This is different for every person. Your dietitian can help you calculate how many carbs you should have at each meal and for each snack. Foods that contain carbs include:  Bread, cereal, rice, pasta, and crackers.  Potatoes and corn.  Peas, beans, and lentils.  Milk and  yogurt.  Fruit and juice.  Desserts, such as cakes, cookies, ice cream, and candy. How does alcohol affect me? Alcohol can cause a sudden decrease in blood glucose (hypoglycemia), especially if you use insulin or take certain oral diabetes medicines. Hypoglycemia can be a life-threatening condition. Symptoms of hypoglycemia (sleepiness, dizziness, and confusion) are similar to symptoms of having too much alcohol. If your health care provider says that alcohol is safe for you, follow these guidelines:  Limit alcohol intake to no more than 1 drink per day for nonpregnant women and 2 drinks per day for men. One drink equals 12 oz of beer, 5 oz of wine, or 1 oz of hard liquor.  Do not drink on an empty stomach.  Keep yourself hydrated with water, diet soda, or unsweetened iced tea.  Keep in mind that regular soda, juice, and other mixers may contain a lot of sugar and must be counted as carbs. What are tips for following this plan?  Reading food labels  Start by checking the serving size on the "Nutrition Facts" label of packaged foods and drinks. The amount of calories, carbs, fats, and other nutrients listed on the label is based on one serving of the item. Many items contain more than one serving per package.  Check the total grams (g) of carbs in one serving. You can calculate the number of servings of carbs in one serving by dividing the total carbs by 15. For example, if a food has 30 g of total carbs, it would be equal to 2 servings of carbs.  Check the number of grams (g) of saturated and trans fats in one serving. Choose foods that have low or no amount of these fats.  Check the number of milligrams (mg) of salt (sodium) in one serving. Most people should limit total sodium intake to less than 2,300 mg per day.  Always check the nutrition information of foods labeled as "low-fat" or "nonfat". These foods may be higher in added sugar or refined carbs and should be avoided.  Talk to  your dietitian to identify your daily goals for nutrients listed on the label. Shopping  Avoid buying canned, premade, or processed foods. These foods tend to be high in fat, sodium, and added sugar.  Shop around the outside edge of the grocery store. This includes fresh fruits and vegetables, bulk grains, fresh meats, and fresh dairy. Cooking  Use low-heat cooking methods, such as baking, instead of high-heat cooking methods like deep frying.  Cook using healthy oils, such as olive, canola, or sunflower oil.  Avoid cooking with butter, cream, or high-fat meats. Meal planning  Eat meals and snacks regularly, preferably at the same times every day. Avoid going long periods of time without eating.  Eat foods high in fiber, such as fresh fruits, vegetables, beans, and whole grains. Talk to your dietitian about how many servings of carbs you can eat at each meal.  Eat 4-6 ounces (oz) of lean protein each day, such as lean meat, chicken, fish, eggs, or tofu. One oz of lean protein is equal to: ? 1 oz of meat, chicken, or fish. ? 1 egg. ?  cup of tofu.  Eat some foods each day that contain healthy fats, such as avocado, nuts, seeds, and fish. Lifestyle  Check your blood glucose regularly.  Exercise regularly as told by your health care provider. This may include: ? 150 minutes of moderate-intensity or vigorous-intensity exercise each week. This could be brisk walking, biking, or water aerobics. ? Stretching and doing strength exercises, such as yoga or weightlifting, at least 2 times a week.  Take medicines as told by your health care provider.  Do not use any products that contain nicotine or tobacco, such as cigarettes and e-cigarettes. If you need help quitting, ask your health care provider.  Work with a Veterinary surgeon or diabetes educator to identify strategies to manage stress and any emotional and social challenges. Questions to ask a health care provider  Do I need to meet with  a diabetes educator?  Do I need to meet with a dietitian?  What number can I call if I have questions?  When are the best times to check my blood glucose? Where to find more information:  American Diabetes Association: diabetes.org  Academy of Nutrition and Dietetics: www.eatright.[employer] of Diabetes and Digestive and Kidney Diseases (NIH): CarFlippers.tn Summary  A healthy meal plan will help you control your blood glucose and maintain a healthy lifestyle.  Working with a diet and nutrition specialist (dietitian) can help you make a meal plan that is best for you.  Keep in mind that carbohydrates (carbs) and alcohol have immediate effects on your blood glucose levels. It is important to count carbs and to use alcohol carefully. This information is not intended to replace advice given to you by your health care provider. Make sure you discuss any questions you have with your health care provider. Document Released: 10/21/2004 Document Revised: 08/24/2016 Document Reviewed: 02/29/2016 Elsevier Interactive Patient Education  2019 [employer].

## 2018-04-26 NOTE — Progress Notes (Signed)
[facility] [address], [place] [address] Phone: [phone] Subjective:   I, Samantha A[place], am serving as a scribe for Dr. Hulan Saas.  I'm seeing this patient by the request  of:    CC: Heel pain  POE:[id number]   02/28/2018: Patient does have more of an Achilles tendinitis bilaterally.  Discussed with patient about a heel lift, home exercise, topical anti-inflammatories.  We discussed proper shoes.  Encourage weight loss.  Patient can continue nitroglycerin but did have some mild skin changes noted and encourage patient to monitor.  Do not feel that injections would be beneficial.  Due to financial constraints patient states she was unable to do physical therapy.  Follow-up again in 4 weeks  Update 04/25/2018: Samantha Ayers is a 48 y.o. female coming in with complaint of bilateral heel pain. Patient states continues and pain overall.  Patient has not noticed any improvement.  Patient will has not been doing any of the exercises.  Is doing The nitroglycerin and doing a full patch at the moment.  Has not noticed any improvement.  Uses the exercises very intermittently.  Has not made changes in the shoes are done the heel lift that has been prescribed previously.      Past Medical History:  Diagnosis Date  . Allergy   . Asthma   . Diabetes mellitus   . Hypertension   . Obesity    No past surgical history on file. Social History   Socioeconomic History  . Marital status: Married    Spouse name: Not on file  . Number of children: 3  . Years of education: 37  . Highest education level: Not on file  Occupational History  . Occupation: Actuary  . Occupation: other  Social Needs  . Financial resource strain: Not on file  . Food insecurity:    Worry: Not on file    Inability: Not on file  . Transportation needs:    Medical: Not on file    Non-medical: Not on file  Tobacco Use  . Smoking status: Never Smoker  .  Smokeless tobacco: Never Used  Substance and Sexual Activity  . Alcohol use: No  . Drug use: No  . Sexual activity: Not Currently    Partners: Male    Birth control/protection: I.U.D.    Comment: regular periods , intercourse age 32, less than 5 sexual partners  Lifestyle  . Physical activity:    Days per week: Not on file    Minutes per session: Not on file  . Stress: Not on file  Relationships  . Social connections:    Talks on phone: Not on file    Gets together: Not on file    Attends religious service: Not on file    Active member of club or organization: Not on file    Attends meetings of clubs or organizations: Not on file    Relationship status: Not on file  Other Topics Concern  . Not on file  Social History Narrative   Married. Immigrated from [place] 16 years ago.   Speaks English well and actively learning more.   She has 3 children which she care for alone right now, husband has returned to [place].   She works 2 jobs and active in her children's activities.   Denies abuse and feels safe at home.    Enjoys cooking.   No Known Allergies Family History  Problem Relation Age of Onset  . Diabetes  Mother   . Diabetes Father   . Breast cancer Neg Hx     Current Outpatient Medications (Endocrine & Metabolic):  Marland Kitchen  Dulaglutide (TRULICITY) 1.5 QM/5.7QI SOPN, Inject 1.5 mg into the skin once a week. .  metFORMIN (GLUCOPHAGE) 1000 MG tablet, Take 1 tablet (1,000 mg total) by mouth 2 (two) times daily with a meal.  Current Outpatient Medications (Cardiovascular):  .  lisinopril-hydrochlorothiazide (PRINZIDE,ZESTORETIC) 20-12.5 MG tablet, TAKE 1 TABLET BY MOUTH  DAILY .  nitroGLYCERIN (NITRODUR - DOSED IN MG/24 HR) 0.2 mg/hr patch, Cut and apply 1/4 patch to most painful area q24h.  Current Outpatient Medications (Respiratory):  .  levocetirizine (XYZAL) 5 MG tablet, Take 1 tablet (5 mg total) by mouth every evening.  Current Outpatient Medications (Analgesics):  .   ibuprofen (ADVIL,MOTRIN) 800 MG tablet, Take 1 tablet (800 mg total) by mouth every 8 (eight) hours as needed. .  meloxicam (MOBIC) 7.5 MG tablet, Take 1 tablet (7.5 mg total) by mouth daily.   Current Outpatient Medications (Other):  Marland Kitchen  ACCU-CHEK FASTCLIX LANCETS MISC, USE TO CHECK BLOOD SUGARS ONCE DAILY .  blood glucose meter kit and supplies KIT, Dispense based on patient and insurance preference. .  blood glucose meter kit and supplies, Dispense based on patient and insurance preference. Use to check blood sugar daily. .  Blood Glucose Monitoring Suppl (ONE TOUCH ULTRA MINI) w/Device KIT, Use meter to check blood sugars 1-4 times daily as instructed. .  Diclofenac Sodium (PENNSAID) 2 % SOLN, Place 1 application onto the skin 2 (two) times daily. Marland Kitchen  glucose blood (ONE TOUCH ULTRA TEST) test strip, Use one strip per test. Test blood sugars 1-4 times daily as instructed. .  Lancets Misc. (ONE TOUCH SURESOFT) MISC, Use 1 lancet per test. Test blood sugars 1-4 times per day as instructed.    Past medical history, social, surgical and family history all reviewed in electronic medical record.  No pertanent information unless stated regarding to the chief complaint.   Review of Systems:  No headache, visual changes, nausea, vomiting, diarrhea, constipation, dizziness, abdominal pain, skin rash, fevers, chills, night sweats, weight loss, swollen lymph nodes, body aches, joint swelling, muscle aches, chest pain, shortness of breath, mood changes.   Objective  There were no vitals taken for this visit.   General: No apparent distress alert and oriented x3 mood and affect normal, dressed appropriately.  HEENT: Pupils equal, extraocular movements intact  Respiratory: Patient's speak in full sentences and does not appear short of breath  Cardiovascular: No lower extremity edema, non tender, no erythema  Skin: Warm dry intact with no signs of infection or rash on extremities or on axial skeleton.   Abdomen: Soft nontender  Neuro: Cranial nerves II through XII are intact, neurovascularly intact in all extremities with 2+ DTRs and 2+ pulses.  Lymph: No lymphadenopathy of posterior or anterior cervical chain or axillae bilaterally.  Gait antalgic MSK:  Non tender with full range of motion and good stability and symmetric strength and tone of shoulders, elbows, wrist, hip, knee bilaterally.  Patient does have pes planus bilaterally.  Patient does have severe tenderness to palpation at the Achilles tendon insertion.  Patient's loss of range of motion.  Negative Thompson though.  Neurovascular intact distally.   Impression and Recommendations:     This case required medical decision making of moderate complexity. The above documentation has been reviewed and is accurate and complete Hulan Saas.      Note: This dictation was  prepared with Dragon dictation along with smaller phrase technology. Any transcriptional errors that result from this process are unintentional.

## 2018-04-26 NOTE — Assessment & Plan Note (Signed)
Discussed the importance of medication compliance in the management of diabetes, as well as long term risks of uncontrolled diabetes  We discussed referral to endocrinology to discuss additional treatment options and she is agreeable She requests referral for annual eye exam Additional education on AVS - Dulaglutide (TRULICITY) 1.5 MG/0.5ML SOPN; Inject 1.5 mg into the skin once a week.  Dispense: 4 pen; Refill: 1 - metFORMIN (GLUCOPHAGE) 1000 MG tablet; Take 1 tablet (1,000 mg total) by mouth 2 (two) times daily with a meal.  Dispense: 180 tablet; Refill: 0 - POCT glycosylated hemoglobin (Hb A1C) - Ambulatory referral to Endocrinology - Ambulatory referral to Ophthalmology

## 2018-04-30 ENCOUNTER — Ambulatory Visit: Payer: BC Managed Care – PPO | Admitting: Family Medicine

## 2018-05-28 ENCOUNTER — Telehealth: Payer: Self-pay

## 2018-05-28 DIAGNOSIS — M79671 Pain in right foot: Secondary | ICD-10-CM

## 2018-05-28 DIAGNOSIS — M79672 Pain in left foot: Principal | ICD-10-CM

## 2018-05-28 MED ORDER — IBUPROFEN 800 MG PO TABS: 800.0000 mg | ORAL_TABLET | Freq: Three times a day (TID) | ORAL | 0 refills | Status: DC | PRN

## 2018-05-28 NOTE — Telephone Encounter (Signed)
I renewed the ibuprofen prescription.  Use a heating pad.  Rest.  VOV or regular office visit if not better. Thx

## 2018-05-28 NOTE — Addendum Note (Signed)
Addended by: Tresa Garter on: 05/28/2018 04:31 PM   Modules accepted: Orders

## 2018-05-28 NOTE — Telephone Encounter (Signed)
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

## 2018-05-28 NOTE — Telephone Encounter (Signed)
Patient says she has been taking 5, 200mg  tablets every 6 hours for pain sp she prefers the muscle relaxer.

## 2018-05-29 ENCOUNTER — Encounter: Payer: Self-pay | Admitting: Internal Medicine

## 2018-05-29 ENCOUNTER — Ambulatory Visit (INDEPENDENT_AMBULATORY_CARE_PROVIDER_SITE_OTHER): Payer: BC Managed Care – PPO | Admitting: Internal Medicine

## 2018-05-29 DIAGNOSIS — M542 Cervicalgia: Secondary | ICD-10-CM

## 2018-05-29 MED ORDER — CYCLOBENZAPRINE HCL 5 MG PO TABS: 5.0000 mg | ORAL_TABLET | Freq: Three times a day (TID) | ORAL | 0 refills | Status: DC | PRN

## 2018-05-29 NOTE — Telephone Encounter (Signed)
Appointment scheduled for today 

## 2018-05-29 NOTE — Assessment & Plan Note (Signed)
Recurrent cervical strain/musculoskeletal pain.  Ibuprofen prescription, Flexeril prescription.  Rice sock heating pad.  Call if no better.

## 2018-05-29 NOTE — Progress Notes (Signed)
We had a planned virtual visit, however, the patient was unable to connect.  The visit was converted into a telephone encounter. Patient has been having left-sided neck pain for over a week, worse with range of motion.  She has had this problem before.  In the past taking 800 mg of ibuprofen and a muscle relaxer helped her. She does not want to come to the office due to coronavirus scare.  Assessment/plan: Recurrent cervical strain/musculoskeletal pain.  Ibuprofen prescription, Flexeril prescription.  Rice sock heating pad.  Call if no better.  Total time 11 minutes

## 2018-05-29 NOTE — Telephone Encounter (Signed)
Routing to scheduling---per dr Posey Rea, patient needs to schedule virtual office visit, please call patient to schedule visit to talk about the need for muscle relaxant

## 2018-06-18 NOTE — Progress Notes (Deleted)
[name] [address] [place], [place] [address] Phone: [phone] Subjective:    I'm seeing this patient by the request  of:    CC:   [id number]  Samantha Ayers is a 48 y.o. female coming in with complaint of ***  Onset-  Location Duration-  Character- Aggravating factors- Reliving factors-  Therapies tried-  Severity-     Past Medical History:  Diagnosis Date  . Allergy   . Asthma   . Diabetes mellitus   . Hypertension   . Obesity    No past surgical history on file. Social History   Socioeconomic History  . Marital status: Married    Spouse name: Not on file  . Number of children: 3  . Years of education: 83  . Highest education level: Not on file  Occupational History  . Occupation: Actuary  . Occupation: other  Social Needs  . Financial resource strain: Not on file  . Food insecurity:    Worry: Not on file    Inability: Not on file  . Transportation needs:    Medical: Not on file    Non-medical: Not on file  Tobacco Use  . Smoking status: Never Smoker  . Smokeless tobacco: Never Used  Substance and Sexual Activity  . Alcohol use: No  . Drug use: No  . Sexual activity: Not Currently    Partners: Male    Birth control/protection: I.U.D.    Comment: regular periods , intercourse age 19, less than 5 sexual partners  Lifestyle  . Physical activity:    Days per week: Not on file    Minutes per session: Not on file  . Stress: Not on file  Relationships  . Social connections:    Talks on phone: Not on file    Gets together: Not on file    Attends religious service: Not on file    Active member of club or organization: Not on file    Attends meetings of clubs or organizations: Not on file    Relationship status: Not on file  Other Topics Concern  . Not on file  Social History Narrative   Married. Immigrated from [place] 16 years ago.   Speaks English well and actively learning more.   She has  3 children which she care for alone right now, husband has returned to [place].   She works 2 jobs and active in her children's activities.   Denies abuse and feels safe at home.    Enjoys cooking.   No Known Allergies Family History  Problem Relation Age of Onset  . Diabetes Mother   . Diabetes Father   . Breast cancer Neg Hx     Current Outpatient Medications (Endocrine & Metabolic):  Marland Kitchen  Dulaglutide (TRULICITY) 1.5 HY/8.5OY SOPN, Inject 1.5 mg into the skin once a week. .  metFORMIN (GLUCOPHAGE) 1000 MG tablet, Take 1 tablet (1,000 mg total) by mouth 2 (two) times daily with a meal.  Current Outpatient Medications (Cardiovascular):  .  lisinopril-hydrochlorothiazide (PRINZIDE,ZESTORETIC) 20-12.5 MG tablet, Take 1 tablet by mouth daily. .  nitroGLYCERIN (NITRODUR - DOSED IN MG/24 HR) 0.2 mg/hr patch, Cut and apply 1/4 patch to most painful area q24h.  Current Outpatient Medications (Respiratory):  .  levocetirizine (XYZAL) 5 MG tablet, Take 1 tablet (5 mg total) by mouth every evening.  Current Outpatient Medications (Analgesics):  .  ibuprofen (ADVIL) 800 MG tablet, Take 1 tablet (800 mg total) by mouth every 8 (  eight) hours as needed. .  meloxicam (MOBIC) 7.5 MG tablet, Take 1 tablet (7.5 mg total) by mouth daily.   Current Outpatient Medications (Other):  Marland Kitchen  ACCU-CHEK FASTCLIX LANCETS MISC, USE TO CHECK BLOOD SUGARS ONCE DAILY .  blood glucose meter kit and supplies KIT, Dispense based on patient and insurance preference. .  blood glucose meter kit and supplies, Dispense based on patient and insurance preference. Use to check blood sugar daily. .  Blood Glucose Monitoring Suppl (ONE TOUCH ULTRA MINI) w/Device KIT, Use meter to check blood sugars 1-4 times daily as instructed. .  cyclobenzaprine (FLEXERIL) 5 MG tablet, Take 1 tablet (5 mg total) by mouth 3 (three) times daily as needed for muscle spasms. .  Diclofenac Sodium (PENNSAID) 2 % SOLN, Place 1 application onto the skin  2 (two) times daily. Marland Kitchen  glucose blood (ONE TOUCH ULTRA TEST) test strip, Use one strip per test. Test blood sugars 1-4 times daily as instructed. .  Lancets Misc. (ONE TOUCH SURESOFT) MISC, Use 1 lancet per test. Test blood sugars 1-4 times per day as instructed.    Past medical history, social, surgical and family history all reviewed in electronic medical record.  No pertanent information unless stated regarding to the chief complaint.   Review of Systems:  No headache, visual changes, nausea, vomiting, diarrhea, constipation, dizziness, abdominal pain, skin rash, fevers, chills, night sweats, weight loss, swollen lymph nodes, body aches, joint swelling, muscle aches, chest pain, shortness of breath, mood changes.   Objective  There were no vitals taken for this visit. Systems examined below as of    General: No apparent distress alert and oriented x3 mood and affect normal, dressed appropriately.  HEENT: Pupils equal, extraocular movements intact  Respiratory: Patient's speak in full sentences and does not appear short of breath  Cardiovascular: No lower extremity edema, non tender, no erythema  Skin: Warm dry intact with no signs of infection or rash on extremities or on axial skeleton.  Abdomen: Soft nontender  Neuro: Cranial nerves II through XII are intact, neurovascularly intact in all extremities with 2+ DTRs and 2+ pulses.  Lymph: No lymphadenopathy of posterior or anterior cervical chain or axillae bilaterally.  Gait normal with good balance and coordination.  MSK:  Non tender with full range of motion and good stability and symmetric strength and tone of shoulders, elbows, wrist, hip, knee and ankles bilaterally.     Impression and Recommendations:     This case required medical decision making of moderate complexity. The above documentation has been reviewed and is accurate and complete Lyndal Pulley, DO       Note: This dictation was prepared with Dragon dictation  along with smaller phrase technology. Any transcriptional errors that result from this process are unintentional.

## 2018-06-21 ENCOUNTER — Ambulatory Visit: Payer: BC Managed Care – PPO | Admitting: Family Medicine

## 2018-07-09 ENCOUNTER — Other Ambulatory Visit: Payer: Self-pay

## 2018-07-09 ENCOUNTER — Ambulatory Visit: Payer: BC Managed Care – PPO | Admitting: Internal Medicine

## 2018-07-09 ENCOUNTER — Encounter: Payer: Self-pay | Admitting: Internal Medicine

## 2018-07-09 VITALS — BP 122/68 | HR 91 | Temp 98.0°F | Ht 68.0 in | Wt 254.8 lb

## 2018-07-09 DIAGNOSIS — E1165 Type 2 diabetes mellitus with hyperglycemia: Secondary | ICD-10-CM | POA: Diagnosis not present

## 2018-07-09 DIAGNOSIS — E119 Type 2 diabetes mellitus without complications: Secondary | ICD-10-CM | POA: Diagnosis not present

## 2018-07-09 DIAGNOSIS — Z9189 Other specified personal risk factors, not elsewhere classified: Secondary | ICD-10-CM | POA: Diagnosis not present

## 2018-07-09 LAB — GLUCOSE, POCT (MANUAL RESULT ENTRY): POC Glucose: 147 mg/dl — AB (ref 70–99)
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
## 2018-07-09 NOTE — Patient Instructions (Signed)
-   Continue Metformin 1000 mg twice a day with meals  - Continue Trulicity 1.5 mg weekly   - Check sugar twice a day fasting and bedtime  - Choose healthy, lower carb lower calorie snacks: toss salad, cooked vegetables, cottage cheese, peanut butter, low fat cheese / string cheese, lower sodium deli meat, tuna salad or chicken salad

## 2018-07-10 DIAGNOSIS — E1165 Type 2 diabetes mellitus with hyperglycemia: Secondary | ICD-10-CM | POA: Insufficient documentation

## 2018-07-10 MED ORDER — GLUCOSE BLOOD VI STRP: ORAL_STRIP | 12 refills | Status: DC

## 2018-07-16 ENCOUNTER — Telehealth: Payer: Self-pay | Admitting: Internal Medicine

## 2018-07-16 NOTE — Telephone Encounter (Signed)
Pt needs a refill of Trulicity, Metformin, and Lisinopril, please advise

## 2018-07-17 ENCOUNTER — Other Ambulatory Visit: Payer: Self-pay | Admitting: *Deleted

## 2018-07-17 DIAGNOSIS — E119 Type 2 diabetes mellitus without complications: Secondary | ICD-10-CM

## 2018-07-17 DIAGNOSIS — I10 Essential (primary) hypertension: Secondary | ICD-10-CM

## 2018-07-17 MED ORDER — DULAGLUTIDE 1.5 MG/0.5ML ~~LOC~~ SOAJ: 1.5000 mg | SUBCUTANEOUS | 1 refills | Status: DC

## 2018-07-17 MED ORDER — METFORMIN HCL 1000 MG PO TABS: 1000.0000 mg | ORAL_TABLET | Freq: Two times a day (BID) | ORAL | 0 refills | Status: DC

## 2018-07-17 MED ORDER — LISINOPRIL-HYDROCHLOROTHIAZIDE 20-12.5 MG PO TABS: 1.0000 | ORAL_TABLET | Freq: Every day | ORAL | 0 refills | Status: DC

## 2018-07-17 NOTE — Telephone Encounter (Signed)
All done already earlier today

## 2018-07-27 ENCOUNTER — Other Ambulatory Visit: Payer: Self-pay

## 2018-07-27 ENCOUNTER — Encounter: Payer: Self-pay | Admitting: Internal Medicine

## 2018-07-27 ENCOUNTER — Ambulatory Visit (INDEPENDENT_AMBULATORY_CARE_PROVIDER_SITE_OTHER): Payer: BC Managed Care – PPO | Admitting: Internal Medicine

## 2018-07-27 ENCOUNTER — Encounter: Payer: BC Managed Care – PPO | Admitting: Nurse Practitioner

## 2018-07-27 ENCOUNTER — Encounter: Payer: BC Managed Care – PPO | Admitting: Internal Medicine

## 2018-07-27 VITALS — BP 138/88 | HR 107 | Temp 98.5°F | Ht 68.0 in | Wt 256.0 lb

## 2018-07-27 DIAGNOSIS — M7661 Achilles tendinitis, right leg: Secondary | ICD-10-CM

## 2018-07-27 DIAGNOSIS — E1165 Type 2 diabetes mellitus with hyperglycemia: Secondary | ICD-10-CM | POA: Diagnosis not present

## 2018-07-27 DIAGNOSIS — E119 Type 2 diabetes mellitus without complications: Secondary | ICD-10-CM

## 2018-07-27 DIAGNOSIS — I10 Essential (primary) hypertension: Secondary | ICD-10-CM | POA: Diagnosis not present

## 2018-07-27 DIAGNOSIS — E559 Vitamin D deficiency, unspecified: Secondary | ICD-10-CM

## 2018-07-27 DIAGNOSIS — M79671 Pain in right foot: Secondary | ICD-10-CM

## 2018-07-27 DIAGNOSIS — M7662 Achilles tendinitis, left leg: Secondary | ICD-10-CM | POA: Diagnosis not present

## 2018-07-27 DIAGNOSIS — Z0001 Encounter for general adult medical examination with abnormal findings: Secondary | ICD-10-CM | POA: Diagnosis not present

## 2018-07-27 MED ORDER — IBUPROFEN 800 MG PO TABS: 800.0000 mg | ORAL_TABLET | Freq: Three times a day (TID) | ORAL | 0 refills | Status: DC | PRN

## 2018-07-27 MED ORDER — TRAMADOL HCL 50 MG PO TABS: 50.0000 mg | ORAL_TABLET | Freq: Four times a day (QID) | ORAL | 0 refills | Status: DC | PRN

## 2018-07-27 MED ORDER — MELOXICAM 15 MG PO TABS: 15.0000 mg | ORAL_TABLET | Freq: Every day | ORAL | 3 refills | Status: DC

## 2018-07-27 NOTE — Patient Instructions (Signed)
Please take all new medication as prescribed - the anti-inflammatory for pain  Please follow up with Dr Tamala Julian for the heels as well  Please continue all other medications as before, and refills have been done if requested.  Please have the pharmacy call with any other refills you may need.  Please continue your efforts at being more active, low cholesterol diet, and weight control.  You are otherwise up to date with prevention measures today.  Please keep your appointments with your specialists as you may have planned  Please return in 3 months - about Sept 9, 2020, or sooner if needed, with Lab testing done 3-5 days before

## 2018-07-27 NOTE — Progress Notes (Signed)
Subjective:    Patient ID: Samantha Ayers, female    DOB: 08/18/1970, 48 y.o.   MRN: [id number]  HPI  Here for wellness and f/u;  Overall doing ok;  Pt denies Chest pain, worsening SOB, DOE, wheezing, orthopnea, PND, worsening LE edema, palpitations, dizziness or syncope.  Pt denies neurological change such as new headache, facial or extremity weakness.  Pt denies polydipsia, polyuria, or low sugar symptoms. Pt states overall good compliance with treatment and medications, good tolerability, and has been trying to follow appropriate diet.  Pt denies worsening depressive symptoms, suicidal ideation or panic. No fever, night sweats, wt loss, loss of appetite, or other constitutional symptoms.  Pt states good ability with ADL's, has low fall risk, home safety reviewed and adequate, no other significant changes in hearing or vision, and only occasionally active with exercise.  From [place]. Sees endo but does not plan to return, out of meds for 2 wks but then restarted 1 wk ago. 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  Ibuprofen has helped, no Gi upset.  Pain still persists.  Has been seen per sports medicine and plans to probably f/u but not scheduled now Past Medical History:  Diagnosis Date  . Allergy   . Asthma   . Diabetes mellitus   . Hypertension   . Obesity    No past surgical history on file.  reports that she has never smoked. She has never used smokeless tobacco. She reports that she does not drink alcohol or use drugs. family history includes Diabetes in her father and mother. No Known Allergies Current Outpatient Medications on File Prior to Visit  Medication Sig Dispense Refill  . ACCU-CHEK FASTCLIX LANCETS MISC USE TO CHECK BLOOD SUGARS ONCE DAILY  0  . cyclobenzaprine (FLEXERIL) 5 MG tablet Take 1 tablet (5 mg total) by mouth 3 (three) times daily as needed for muscle spasms. 30  tablet 0  . Diclofenac Sodium (PENNSAID) 2 % SOLN Place 1 application onto the skin 2 (two) times daily. 1 Bottle 1  . Dulaglutide (TRULICITY) 1.5 MG/0.5ML SOPN Inject 1.5 mg into the skin once a week. 4 pen 1  . glucose blood (ONETOUCH VERIO) test strip 2x daily 100 each 12  . Lancets Misc. (ONE TOUCH SURESOFT) MISC Use 1 lancet per test. Test blood sugars 1-4 times per day as instructed. 1 each 1  . levocetirizine (XYZAL) 5 MG tablet Take 1 tablet (5 mg total) by mouth every evening. 90 tablet 3  . lisinopril-hydrochlorothiazide (ZESTORETIC) 20-12.5 MG tablet Take 1 tablet by mouth daily. 90 tablet 0  . metFORMIN (GLUCOPHAGE) 1000 MG tablet Take 1 tablet (1,000 mg total) by mouth 2 (two) times daily with a meal. 180 tablet 0  . nitroGLYCERIN (NITRODUR - DOSED IN MG/24 HR) 0.2 mg/hr patch Cut and apply 1/4 patch to most painful area q24h. 30 patch 11   No current facility-administered medications on file prior to visit.    Review of Systems Constitutional: Negative for other unusual diaphoresis, sweats, appetite or weight changes HENT: Negative for other worsening hearing loss, ear pain, facial swelling, mouth sores or neck stiffness.   Eyes: Negative for other worsening pain, redness or other visual disturbance.  Respiratory: Negative for other stridor or swelling Cardiovascular: Negative for other palpitations or other chest pain  Gastrointestinal: Negative for worsening diarrhea or loose stools, blood  in stool, distention or other pain Genitourinary: Negative for hematuria, flank pain or other change in urine volume.  Musculoskeletal: Negative for myalgias or other joint swelling.  Skin: Negative for other color change, or other wound or worsening drainage.  Neurological: Negative for other syncope or numbness. Hematological: Negative for other adenopathy or swelling Psychiatric/Behavioral: Negative for hallucinations, other worsening agitation, SI, self-injury, or new decreased  concentration All other system neg per pt    Objective:   Physical Exam BP 138/88   Pulse (!) 107   Temp 98.5 F (36.9 C) (Oral)   Ht 5\' 8"  (1.727 m)   Wt 256 lb (116.1 kg)   SpO2 98%   BMI 38.92 kg/m  VS noted,  Constitutional: Pt is oriented to person, place, and time. Appears well-developed and well-nourished, in no significant distress and comfortable Head: Normocephalic and atraumatic  Eyes: Conjunctivae and EOM are normal. Pupils are equal, round, and reactive to light Right Ear: External ear normal without discharge Left Ear: External ear normal without discharge Nose: Nose without discharge or deformity Mouth/Throat: Oropharynx is without other ulcerations and moist  Neck: Normal range of motion. Neck supple. No JVD present. No tracheal deviation present or significant neck LA or mass Cardiovascular: Normal rate, regular rhythm, normal heart sounds and intact distal pulses.   Pulmonary/Chest: WOB normal and breath sounds without rales or wheezing  Abdominal: Soft. Bowel sounds are normal. NT. No HSM  Musculoskeletal: Normal range of motion. Exhibits no edema, but has tender bilat achilles insertion site redness and mild swelling o/w neurovasc intact  Lymphadenopathy: Has no other cervical adenopathy.  Neurological: Pt is alert and oriented to person, place, and time. Pt has normal reflexes. No cranial nerve deficit. Motor grossly intact, Gait intact Skin: Skin is warm and dry. No rash noted or new ulcerations Psychiatric:  Has normal mood and affect. Behavior is normal without agitation No other exam findings Lab Results  Component Value Date   WBC 9.7 07/19/2017   HGB 11.5 (L) 07/19/2017   HCT 33.7 (L) 07/19/2017   PLT 327.0 07/19/2017   GLUCOSE 321 (H) 07/19/2017   CHOL 156 12/05/2016   TRIG 120.0 12/05/2016   HDL 39.90 12/05/2016   LDLCALC 92 12/05/2016   ALT 17 07/19/2017   AST 15 07/19/2017   NA 136 07/19/2017   K 3.9 07/19/2017   CL 102 07/19/2017    CREATININE 0.49 07/19/2017   BUN 8 07/19/2017   CO2 26 07/19/2017   TSH 0.54 12/05/2016   HGBA1C 10.2 (A) 04/26/2018   MICROALBUR 1.3 12/05/2016      Assessment & Plan:

## 2018-07-27 NOTE — Assessment & Plan Note (Addendum)
Mod to severe persistent, exacerbated by daily work, ok for ibuprofen refill, also tramadol prn breakthrough pain, and f/u sport medicine  In addition to the time spent performing CPE, I spent an additional 25 minutes face to face,in which greater than 50% of this time was spent in counseling and coordination of care for patient's acute illness as documented, including the differential dx, treatment, further evaluation and other management of bilateral achilles tendonitis, DM, HTN

## 2018-07-27 NOTE — Assessment & Plan Note (Signed)
stable overall by history and exam, recent data reviewed with pt, and pt to continue medical treatment as before,  to f/u any worsening symptoms or concerns  

## 2018-07-27 NOTE — Assessment & Plan Note (Signed)
Out of med x 2 wks, then on again for 1 wk, cbgs not well controlled, for contd same tx, DM diet, and f/u a1c with next visit

## 2018-07-27 NOTE — Assessment & Plan Note (Signed)

## 2018-09-17 ENCOUNTER — Other Ambulatory Visit: Payer: Self-pay | Admitting: Internal Medicine

## 2018-09-17 DIAGNOSIS — I10 Essential (primary) hypertension: Secondary | ICD-10-CM

## 2018-09-17 DIAGNOSIS — E119 Type 2 diabetes mellitus without complications: Secondary | ICD-10-CM

## 2018-10-13 ENCOUNTER — Other Ambulatory Visit: Payer: Self-pay | Admitting: Internal Medicine

## 2018-10-13 DIAGNOSIS — E119 Type 2 diabetes mellitus without complications: Secondary | ICD-10-CM

## 2018-10-29 ENCOUNTER — Other Ambulatory Visit (INDEPENDENT_AMBULATORY_CARE_PROVIDER_SITE_OTHER): Payer: BC Managed Care – PPO

## 2018-10-29 ENCOUNTER — Telehealth: Payer: Self-pay

## 2018-10-29 DIAGNOSIS — E119 Type 2 diabetes mellitus without complications: Secondary | ICD-10-CM | POA: Diagnosis not present

## 2018-10-29 DIAGNOSIS — E559 Vitamin D deficiency, unspecified: Secondary | ICD-10-CM

## 2018-10-29 LAB — MICROALBUMIN / CREATININE URINE RATIO
Creatinine,U: 156 mg/dL
Microalb Creat Ratio: 0.7 mg/g (ref 0.0–30.0)
Microalb, Ur: 1 mg/dL (ref 0.0–1.9)

## 2018-10-29 LAB — CBC WITH DIFFERENTIAL/PLATELET
Basophils Absolute: 0.1 10*3/uL (ref 0.0–0.1)
Basophils Relative: 1.3 % (ref 0.0–3.0)
Eosinophils Absolute: 0.2 10*3/uL (ref 0.0–0.7)
Eosinophils Relative: 1.7 % (ref 0.0–5.0)
HCT: 33.7 % — ABNORMAL LOW (ref 36.0–46.0)
Hemoglobin: 11.4 g/dL — ABNORMAL LOW (ref 12.0–15.0)
Lymphocytes Relative: 42.6 % (ref 12.0–46.0)
Lymphs Abs: 4.2 10*3/uL — ABNORMAL HIGH (ref 0.7–4.0)
MCHC: 33.8 g/dL (ref 30.0–36.0)
MCV: 72.1 fl — ABNORMAL LOW (ref 78.0–100.0)
Monocytes Absolute: 0.9 10*3/uL (ref 0.1–1.0)
Monocytes Relative: 8.8 % (ref 3.0–12.0)
Neutro Abs: 4.5 10*3/uL (ref 1.4–7.7)
Neutrophils Relative %: 45.6 % (ref 43.0–77.0)
Platelets: 363 10*3/uL (ref 150.0–400.0)
RBC: 4.68 Mil/uL (ref 3.87–5.11)
RDW: 18.2 % — ABNORMAL HIGH (ref 11.5–15.5)
WBC: 9.8 10*3/uL (ref 4.0–10.5)

## 2018-10-29 NOTE — Telephone Encounter (Signed)
Confirmed with patient her appointment tomorrow as she does not want to change any longer.

## 2018-10-30 ENCOUNTER — Encounter: Payer: Self-pay | Admitting: Family Medicine

## 2018-10-30 ENCOUNTER — Ambulatory Visit (INDEPENDENT_AMBULATORY_CARE_PROVIDER_SITE_OTHER): Payer: BC Managed Care – PPO | Admitting: Family Medicine

## 2018-10-30 ENCOUNTER — Encounter: Payer: Self-pay | Admitting: Internal Medicine

## 2018-10-30 ENCOUNTER — Other Ambulatory Visit: Payer: Self-pay

## 2018-10-30 ENCOUNTER — Other Ambulatory Visit: Payer: Self-pay | Admitting: Internal Medicine

## 2018-10-30 ENCOUNTER — Ambulatory Visit: Payer: Self-pay

## 2018-10-30 ENCOUNTER — Ambulatory Visit (INDEPENDENT_AMBULATORY_CARE_PROVIDER_SITE_OTHER): Payer: BC Managed Care – PPO | Admitting: Internal Medicine

## 2018-10-30 VITALS — BP 146/88 | HR 104 | Temp 98.8°F | Ht 68.0 in | Wt 258.0 lb

## 2018-10-30 VITALS — Ht 68.0 in | Wt 258.0 lb

## 2018-10-30 DIAGNOSIS — E785 Hyperlipidemia, unspecified: Secondary | ICD-10-CM

## 2018-10-30 DIAGNOSIS — E538 Deficiency of other specified B group vitamins: Secondary | ICD-10-CM

## 2018-10-30 DIAGNOSIS — M25511 Pain in right shoulder: Secondary | ICD-10-CM | POA: Diagnosis not present

## 2018-10-30 DIAGNOSIS — M79671 Pain in right foot: Secondary | ICD-10-CM

## 2018-10-30 DIAGNOSIS — E559 Vitamin D deficiency, unspecified: Secondary | ICD-10-CM

## 2018-10-30 DIAGNOSIS — M79672 Pain in left foot: Secondary | ICD-10-CM

## 2018-10-30 DIAGNOSIS — E611 Iron deficiency: Secondary | ICD-10-CM | POA: Diagnosis not present

## 2018-10-30 DIAGNOSIS — M25519 Pain in unspecified shoulder: Secondary | ICD-10-CM | POA: Insufficient documentation

## 2018-10-30 DIAGNOSIS — M25512 Pain in left shoulder: Secondary | ICD-10-CM | POA: Diagnosis not present

## 2018-10-30 DIAGNOSIS — Z23 Encounter for immunization: Secondary | ICD-10-CM | POA: Diagnosis not present

## 2018-10-30 DIAGNOSIS — E1165 Type 2 diabetes mellitus with hyperglycemia: Secondary | ICD-10-CM

## 2018-10-30 DIAGNOSIS — I10 Essential (primary) hypertension: Secondary | ICD-10-CM

## 2018-10-30 DIAGNOSIS — E119 Type 2 diabetes mellitus without complications: Secondary | ICD-10-CM

## 2018-10-30 LAB — URINALYSIS, ROUTINE W REFLEX MICROSCOPIC
Bilirubin Urine: NEGATIVE
Hgb urine dipstick: NEGATIVE
Ketones, ur: NEGATIVE
Nitrite: NEGATIVE
RBC / HPF: NONE SEEN (ref 0–?)
Specific Gravity, Urine: 1.025 (ref 1.000–1.030)
Total Protein, Urine: NEGATIVE
Urine Glucose: NEGATIVE
Urobilinogen, UA: 0.2 (ref 0.0–1.0)
pH: 6 (ref 5.0–8.0)

## 2018-10-30 LAB — LDL CHOLESTEROL, DIRECT: Direct LDL: 92 mg/dL

## 2018-10-30 LAB — TSH: TSH: 0.74 u[IU]/mL (ref 0.35–4.50)

## 2018-10-30 LAB — BASIC METABOLIC PANEL
BUN: 16 mg/dL (ref 6–23)
CO2: 24 mEq/L (ref 19–32)
Calcium: 9.5 mg/dL (ref 8.4–10.5)
Chloride: 99 mEq/L (ref 96–112)
Creatinine, Ser: 0.69 mg/dL (ref 0.40–1.20)
GFR: 110.88 mL/min (ref >60.00)
Glucose, Bld: 243 mg/dL — ABNORMAL HIGH (ref 70–99)
Potassium: 4.2 mEq/L (ref 3.5–5.1)
Sodium: 134 mEq/L — ABNORMAL LOW (ref 135–145)

## 2018-10-30 LAB — HEPATIC FUNCTION PANEL
ALT: 14 U/L (ref 0–35)
AST: 11 U/L (ref 0–37)
Albumin: 4 g/dL (ref 3.5–5.2)
Alkaline Phosphatase: 72 U/L (ref 39–117)
Bilirubin, Direct: 0 mg/dL (ref 0.0–0.3)
Total Bilirubin: 0.3 mg/dL (ref 0.2–1.2)
Total Protein: 7.7 g/dL (ref 6.0–8.3)

## 2018-10-30 LAB — LIPID PANEL
Cholesterol: 158 mg/dL (ref 0–200)
HDL: 34.2 mg/dL — ABNORMAL LOW (ref >39.00)
NonHDL: 123.58
Total CHOL/HDL Ratio: 5
Triglycerides: 355 mg/dL — ABNORMAL HIGH (ref 0.0–149.0)
VLDL: 71 mg/dL — ABNORMAL HIGH (ref 0.0–40.0)

## 2018-10-30 LAB — VITAMIN D 25 HYDROXY (VIT D DEFICIENCY, FRACTURES): VITD: 19.07 ng/mL — ABNORMAL LOW (ref 30.00–100.00)

## 2018-10-30 LAB — HEMOGLOBIN A1C: Hgb A1c MFr Bld: 8.6 % — ABNORMAL HIGH (ref 4.6–6.5)

## 2018-10-30 MED ORDER — VITAMIN D (ERGOCALCIFEROL) 1.25 MG (50000 UNIT) PO CAPS: 50000.0000 [IU] | ORAL_CAPSULE | ORAL | 0 refills | Status: DC

## 2018-10-30 MED ORDER — TRULICITY 1.5 MG/0.5ML ~~LOC~~ SOAJ: SUBCUTANEOUS | 5 refills | Status: DC

## 2018-10-30 MED ORDER — GLIPIZIDE ER 5 MG PO TB24: 5.0000 mg | ORAL_TABLET | Freq: Every day | ORAL | 3 refills | Status: DC

## 2018-10-30 MED ORDER — TRULICITY 1.5 MG/0.5ML ~~LOC~~ SOAJ: SUBCUTANEOUS | 3 refills | Status: DC

## 2018-10-30 NOTE — Assessment & Plan Note (Signed)
Bilateral injections given today.  Tolerated the procedure well.  Discussed icing regimen and home exercise, discussed avoiding certain activities.  Topical anti-inflammatories given, discussed keeping hands within peripheral vision, follow-up again in 3 months if not improved

## 2018-10-30 NOTE — Assessment & Plan Note (Signed)
stable overall by history and exam, recent data reviewed with pt, and pt to continue medical treatment as before,  to f/u any worsening symptoms or concerns  

## 2018-10-30 NOTE — Progress Notes (Signed)
Subjective:    Patient ID: Samantha Ayers, female    DOB: 1970-04-13, 48 y.o.   MRN: [id number]  HPI  ,Here to f/u; overall doing ok,  Pt denies chest pain, increasing sob or doe, wheezing, orthopnea, PND, increased LE swelling, palpitations, dizziness or syncope.  Pt denies new neurological symptoms such as new headache, or facial or extremity weakness or numbness.  Pt denies polydipsia, polyuria, or low sugar episode.  Pt states overall good compliance with meds, mostly trying to follow appropriate diet, with wt overall stable,  but little exercise however.  No new complaints, except joint pain to see sport med this am Past Medical History:  Diagnosis Date  . Allergy   . Asthma   . Diabetes mellitus   . Hypertension   . Obesity    No past surgical history on file.  reports that she has never smoked. She has never used smokeless tobacco. She reports that she does not drink alcohol or use drugs. family history includes Diabetes in her father and mother. No Known Allergies Current Outpatient Medications on File Prior to Visit  Medication Sig Dispense Refill  . ACCU-CHEK FASTCLIX LANCETS MISC USE TO CHECK BLOOD SUGARS ONCE DAILY  0  . cyclobenzaprine (FLEXERIL) 5 MG tablet Take 1 tablet (5 mg total) by mouth 3 (three) times daily as needed for muscle spasms. 30 tablet 0  . Diclofenac Sodium (PENNSAID) 2 % SOLN Place 1 application onto the skin 2 (two) times daily. 1 Bottle 1  . glucose blood (ONETOUCH VERIO) test strip 2x daily 100 each 12  . ibuprofen (ADVIL) 800 MG tablet Take 1 tablet (800 mg total) by mouth every 8 (eight) hours as needed. 30 tablet 0  . Lancets Misc. (ONE TOUCH SURESOFT) MISC Use 1 lancet per test. Test blood sugars 1-4 times per day as instructed. 1 each 1  . levocetirizine (XYZAL) 5 MG tablet Take 1 tablet (5 mg total) by mouth every evening. 90 tablet 3  . lisinopril-hydrochlorothiazide (ZESTORETIC) 20-12.5 MG tablet Take 1 tablet by mouth once daily 90  tablet 0  . metFORMIN (GLUCOPHAGE) 1000 MG tablet TAKE 1 TABLET BY MOUTH 2 TIMES A DAY WITH MEAL. 180 tablet 0  . nitroGLYCERIN (NITRODUR - DOSED IN MG/24 HR) 0.2 mg/hr patch Cut and apply 1/4 patch to most painful area q24h. 30 patch 11  . traMADol (ULTRAM) 50 MG tablet Take 1 tablet (50 mg total) by mouth every 6 (six) hours as needed. 30 tablet 0   No current facility-administered medications on file prior to visit.    Review of Systems  Constitutional: Negative for other unusual diaphoresis or sweats HENT: Negative for ear discharge or swelling Eyes: Negative for other worsening visual disturbances Respiratory: Negative for stridor or other swelling  Gastrointestinal: Negative for worsening distension or other blood Genitourinary: Negative for retention or other urinary change Musculoskeletal: Negative for other MSK pain or swelling Skin: Negative for color change or other new lesions Neurological: Negative for worsening tremors and other numbness  Psychiatric/Behavioral: Negative for worsening agitation or other fatigue All otherwise neg per pt    Objective:   Physical Exam BP (!) 146/88   Pulse (!) 104   Temp 98.8 F (37.1 C) (Oral)   Ht 5\' 8"  (1.727 m)   Wt 258 lb (117 kg)   SpO2 98%   BMI 39.23 kg/m  VS noted,  Constitutional: Pt appears in NAD HENT: Head: NCAT.  Right Ear: External ear normal.  Left Ear:  External ear normal.  Eyes: . Pupils are equal, round, and reactive to light. Conjunctivae and EOM are normal Nose: without d/c or deformity Neck: Neck supple. Gross normal ROM Cardiovascular: Normal rate and regular rhythm.   Pulmonary/Chest: Effort normal and breath sounds without rales or wheezing.  Abd:  Soft, NT, ND, + BS, no organomegaly Neurological: Pt is alert. At baseline orientation, motor grossly intact Skin: Skin is warm. No rashes, other new lesions, no LE edema Psychiatric: Pt behavior is normal without agitation  All otherwise neg per pt Lab  Results  Component Value Date   WBC 9.8 10/29/2018   HGB 11.4 (L) 10/29/2018   HCT 33.7 (L) 10/29/2018   PLT 363.0 10/29/2018   GLUCOSE 243 (H) 10/29/2018   CHOL 158 10/29/2018   TRIG 355.0 (H) 10/29/2018   HDL 34.20 (L) 10/29/2018   LDLDIRECT 92.0 10/29/2018   LDLCALC 92 12/05/2016   ALT 14 10/29/2018   AST 11 10/29/2018   NA 134 (L) 10/29/2018   K 4.2 10/29/2018   CL 99 10/29/2018   CREATININE 0.69 10/29/2018   BUN 16 10/29/2018   CO2 24 10/29/2018   TSH 0.74 10/29/2018   HGBA1C 8.6 (H) 10/29/2018   MICROALBUR 1.0 10/29/2018       Assessment & Plan:

## 2018-10-30 NOTE — Progress Notes (Signed)
[facility] [address] [place], [place] [address] Phone: [phone] Subjective:    I'm seeing this patient by the request  of:    CC: Heel pain follow up   QAS:[id number]   04/26/2018 Discussed again with patient at length.  Patient denied any type of CAM Walker, unable to afford physical therapy, patient is wearing very poor shoes and we discussed a rigid bottom or even a rocker-bottom shoe that I think will be more beneficial.  Patient has been noncompliant on the heel lift with the home exercises as well.  We discussed the core stability and weight loss that could be beneficial.  Patient given another 2 months and try to make these changes and then further evaluation.  Discussed again I do not feel comfortable with patient's blood sugars as well as injection of a loadbearing tendon at this point would be beneficial for her.  Spent  25 minutes with patient face-to-face and had greater than 50% of counseling including as described above in assessment and plan.  Update 10/30/2018 Samantha Ayers is a 48 y.o. female coming in with complaint of bilateral achilles tendonitis. Patient states she has been having bilateral shoulder pain. Pain in right arm with working. Pain throughout the joint. R>L. Denies any radiating symptoms. Is unable to sleep. Pain occurring for one month.      Past Medical History:  Diagnosis Date  . Allergy   . Asthma   . Diabetes mellitus   . Hypertension   . Obesity    No past surgical history on file. Social History   Socioeconomic History  . Marital status: Married    Spouse name: Not on file  . Number of children: 3  . Years of education: 63  . Highest education level: Not on file  Occupational History  . Occupation: Actuary  . Occupation: other  Social Needs  . Financial resource strain: Not on file  . Food insecurity    Worry: Not on file    Inability: Not on file  . Transportation needs    Medical: Not  on file    Non-medical: Not on file  Tobacco Use  . Smoking status: Never Smoker  . Smokeless tobacco: Never Used  Substance and Sexual Activity  . Alcohol use: No  . Drug use: No  . Sexual activity: Not Currently    Partners: Male    Birth control/protection: I.U.D.    Comment: regular periods , intercourse age 62, less than 5 sexual partners  Lifestyle  . Physical activity    Days per week: Not on file    Minutes per session: Not on file  . Stress: Not on file  Relationships  . Social Herbalist on phone: Not on file    Gets together: Not on file    Attends religious service: Not on file    Active member of club or organization: Not on file    Attends meetings of clubs or organizations: Not on file    Relationship status: Not on file  Other Topics Concern  . Not on file  Social History Narrative   Married. Immigrated from [place] 16 years ago.   Speaks English well and actively learning more.   She has 3 children which she care for alone right now, husband has returned to [place].   She works 2 jobs and active in her children's activities.   Denies abuse and feels safe at home.  Enjoys cooking.   No Known Allergies Family History  Problem Relation Age of Onset  . Diabetes Mother   . Diabetes Father   . Breast cancer Neg Hx     Current Outpatient Medications (Endocrine & Metabolic):  Marland Kitchen  Dulaglutide (TRULICITY) 1.5 MG/0.5ML SOPN, INJECT 1.5 MG  SUBCUTANEOUSLY ONCE A WEEK .  metFORMIN (GLUCOPHAGE) 1000 MG tablet, TAKE 1 TABLET BY MOUTH 2 TIMES A DAY WITH MEAL.  Current Outpatient Medications (Cardiovascular):  .  lisinopril-hydrochlorothiazide (ZESTORETIC) 20-12.5 MG tablet, Take 1 tablet by mouth once daily .  nitroGLYCERIN (NITRODUR - DOSED IN MG/24 HR) 0.2 mg/hr patch, Cut and apply 1/4 patch to most painful area q24h.  Current Outpatient Medications (Respiratory):  .  levocetirizine (XYZAL) 5 MG tablet, Take 1 tablet (5 mg total) by mouth every  evening.  Current Outpatient Medications (Analgesics):  .  ibuprofen (ADVIL) 800 MG tablet, Take 1 tablet (800 mg total) by mouth every 8 (eight) hours as needed. .  traMADol (ULTRAM) 50 MG tablet, Take 1 tablet (50 mg total) by mouth every 6 (six) hours as needed.   Current Outpatient Medications (Other):  Marland Kitchen  ACCU-CHEK FASTCLIX LANCETS MISC, USE TO CHECK BLOOD SUGARS ONCE DAILY .  cyclobenzaprine (FLEXERIL) 5 MG tablet, Take 1 tablet (5 mg total) by mouth 3 (three) times daily as needed for muscle spasms. .  Diclofenac Sodium (PENNSAID) 2 % SOLN, Place 1 application onto the skin 2 (two) times daily. Marland Kitchen  glucose blood (ONETOUCH VERIO) test strip, 2x daily .  Lancets Misc. (ONE TOUCH SURESOFT) MISC, Use 1 lancet per test. Test blood sugars 1-4 times per day as instructed.    Past medical history, social, surgical and family history all reviewed in electronic medical record.  No pertanent information unless stated regarding to the chief complaint.   Review of Systems:  No headache, visual changes, nausea, vomiting, diarrhea, constipation, dizziness, abdominal pain, skin rash, fevers, chills, night sweats, weight loss, swollen lymph nodes, body aches, joint swelling, muscle aches, chest pain, shortness of breath, mood changes.   Objective  Height 5\' 8"  (1.727 m), weight 258 lb (117 kg).    General: No apparent distress alert and oriented x3 mood and affect normal, dressed appropriately.  HEENT: Pupils equal, extraocular movements intact  Respiratory: Patient's speak in full sentences and does not appear short of breath  Cardiovascular: No lower extremity edema, non tender, no erythema  Skin: Warm dry intact with no signs of infection or rash on extremities or on axial skeleton.  Abdomen: Soft nontender  Neuro: Cranial nerves II through XII are intact, neurovascularly intact in all extremities with 2+ DTRs and 2+ pulses.  Lymph: No lymphadenopathy of posterior or anterior cervical chain  or axillae bilaterally.  Gait normal with good balance and coordination.  MSK:  Non tender with full range of motion and good stability and symmetric strength and tone of , elbows, wrist, hip, knee bilaterally.  Shoulder: bilateral  Inspection reveals no abnormalities, atrophy or asymmetry. Palpation is normal with no tenderness over AC joint or bicipital groove. ROM is full in all planes passively. Rotator cuff strength normal throughout. signs of impingement with positive Neer and Hawkin's tests, but negative empty can sign. Speeds and Yergason's tests normal. No labral pathology noted with negative Obrien's, negative clunk and good stability. Positive cross over Normal scapular function observed. No painful arc and no drop arm sign. No apprehension sign  MSK US performed of: bilateral  This study was ordered, performed, and  interpreted by Terrilee Files D.O.  Shoulder:   Supraspinatus:  Appears normal on long and transverse views, Bursal bulge seen with shoulder abduction on impingement view. Infraspinatus:  Appears normal on long and transverse views. Significant increase in Doppler flow Subscapularis:  Appears normal on long and transverse views. Positive bursa Teres Minor:  Appears normal on long and transverse views. AC joint:  Capsule distention noted with moderate arthritic changes bilaterally Glenohumeral Joint:  Appears normal without effusion. Glenoid Labrum:  Intact without visualized tears. Biceps Tendon:  Appears normal on long and transverse views, no fraying of tendon, tendon located in intertubercular groove, no subluxation with shoulder internal or external rotation.  Impression: Bilateral acromioclavicular arthritis  Procedure: Real-time Ultrasound Guided Injection of right acromioclavicular joint Device: GE Logiq E  Ultrasound guided injection is preferred based studies that show increased duration, increased effect, greater accuracy, decreased procedural pain,  increased response rate with ultrasound guided versus blind injection.  Verbal informed consent obtained.  Time-out conducted.  Noted no overlying erythema, induration, or other signs of local infection.  Skin prepped in a sterile fashion.  Local anesthesia: Topical Ethyl chloride.  With sterile technique and under real time ultrasound guidance:  Joint visualized.  23g 1  inch needle inserted posterior approach. Pictures taken for needle placement.  Injected with 0.5 cc of 0.5% Marcaine and 0.5 cc of Kenalog 40 mg/mL Completed without difficulty  Pain immediately resolved suggesting accurate placement of the medication.  Advised to call if fevers/chills, erythema, induration, drainage, or persistent bleeding.  Images permanently stored and available for review in the ultrasound unit.  Impression: Technically successful ultrasound guided injection.  Procedure: Real-time Ultrasound Guided Injection of left acromioclavicular joint Device: GE Logiq Q7 Ultrasound guided injection is preferred based studies that show increased duration, increased effect, greater accuracy, decreased procedural pain, increased response rate, and decreased cost with ultrasound guided versus blind injection.  Verbal informed consent obtained.  Time-out conducted.  Noted no overlying erythema, induration, or other signs of local infection.  Skin prepped in a sterile fashion.  Local anesthesia: Topical Ethyl chloride.  With sterile technique and under real time ultrasound guidance: With a 25-gauge 1 inch needle injected with 0.5 cc of 0.5% 0.5 cc of Kenalog 40 mg/mL Completed without difficulty  Pain immediately resolved suggesting accurate placement of the medication.  Advised to call if fevers/chills, erythema, induration, drainage, or persistent bleeding.  Images permanently stored and available for review in the ultrasound unit.  Impression: Technically successful ultrasound guided injection.    Impression and  Recommendations:     This case required medical decision making of moderate complexity. The above documentation has been reviewed and is accurate and complete Judi Saa, DO       Note: This dictation was prepared with Dragon dictation along with smaller phrase technology. Any transcriptional errors that result from this process are unintentional.

## 2018-10-30 NOTE — Patient Instructions (Signed)
Injected both AC joints today Exercises 3x a week Pennsaid 2x a day as needed finger tip sized amount See me again in 8 weeks

## 2018-10-30 NOTE — Patient Instructions (Signed)
Please continue all other medications as before, and refills have been done if requested.  Please have the pharmacy call with any other refills you may need.  Please continue your efforts at being more active, low cholesterol diet, and weight control.  Please keep your appointments with your specialists as you may have planned  Your lab work from yesterday is pending  You will be contacted by phone if any changes need to be made immediately.  Otherwise, you will receive a letter about your results with an explanation, but please check with MyChart first.  Please remember to sign up for MyChart if you have not done so, as this will be important to you in the future with finding out test results, communicating by private email, and scheduling acute appointments online when needed.  Please return in 6 months, or sooner if needed, with Lab testing done 3-5 days before

## 2018-10-31 ENCOUNTER — Telehealth: Payer: Self-pay

## 2018-10-31 NOTE — Telephone Encounter (Signed)
Called pt, LVM.   CRM created.  

## 2018-10-31 NOTE — Telephone Encounter (Signed)
-----   Message from Biagio Borg, MD sent at 10/30/2018  7:02 PM EDT ----- Left message on MyChart, pt to cont same tx except  The test results show that your current treatment is OK, as the tests are stable including the improved A1c (for sugar), but the A1c is still mildly too high, and the Vitamin D level is low.  Please start glipizide ER 5 mg per day (and continue all other medications).  Also, Please take Vitamin D 50000 units weekly for 12 weeks, then plan to change to OTC Vitamin D3 at 2000 units per day, indefinitely.    Shirron to please inform pt, I will do rx x 2

## 2018-12-24 NOTE — Progress Notes (Signed)
[facility] [address] [place], [place] [address] Phone: [phone] Subjective:    I'm seeing this patient by the request  of:    CC: Bilateral flank pain, shoulder pain follow-up  JTT:[id number]   10/30/2018 Bilateral injections given today.  Tolerated the procedure well.  Discussed icing regimen and home exercise, discussed avoiding certain activities.  Topical anti-inflammatories given, discussed keeping hands within peripheral vision, follow-up again in 3 months if not improved  Update 12/25/2018 Samantha Ayers is a 48 y.o. female coming in with complaint of bilateral shoulder pain. Had 1 week of relief from injections. Has trouble sleeping due to pain. Does not taken any medication at night.   Continued pain over the achilles insertion bilaterally. Pain increases with standing a lot or in the morning when she wakes up. Has been using Pennsaid prn.    Bilateral acromioclavicular joints were injected October 30, 2018  Past Medical History:  Diagnosis Date  . Allergy   . Asthma   . Diabetes mellitus   . Hypertension   . Obesity    No past surgical history on file. Social History   Socioeconomic History  . Marital status: Married    Spouse name: Not on file  . Number of children: 3  . Years of education: 46  . Highest education level: Not on file  Occupational History  . Occupation: Stage manager  . Occupation: other  Social Needs  . Financial resource strain: Not on file  . Food insecurity    Worry: Not on file    Inability: Not on file  . Transportation needs    Medical: Not on file    Non-medical: Not on file  Tobacco Use  . Smoking status: Never Smoker  . Smokeless tobacco: Never Used  Substance and Sexual Activity  . Alcohol use: No  . Drug use: No  . Sexual activity: Not Currently    Partners: Male    Birth control/protection: I.U.D.    Comment: regular periods , intercourse age 48, less than 5 sexual partners   Lifestyle  . Physical activity    Days per week: Not on file    Minutes per session: Not on file  . Stress: Not on file  Relationships  . Social Musician on phone: Not on file    Gets together: Not on file    Attends religious service: Not on file    Active member of club or organization: Not on file    Attends meetings of clubs or organizations: Not on file    Relationship status: Not on file  Other Topics Concern  . Not on file  Social History Narrative   Married. Immigrated from [place] 16 years ago.   Speaks English well and actively learning more.   She has 3 children which she care for alone right now, husband has returned to [place].   She works 2 jobs and active in her children's activities.   Denies abuse and feels safe at home.    Enjoys cooking.   No Known Allergies Family History  Problem Relation Age of Onset  . Diabetes Mother   . Diabetes Father   . Breast cancer Neg Hx     Current Outpatient Medications (Endocrine & Metabolic):  Marland Kitchen  Dulaglutide (TRULICITY) 1.5 MG/0.5ML SOPN, INJECT 1.5 MG  SUBCUTANEOUSLY ONCE A WEEK .  glipiZIDE (GLUCOTROL XL) 5 MG 24 hr tablet, Take 1 tablet (5 mg total) by mouth daily with  breakfast. .  metFORMIN (GLUCOPHAGE) 1000 MG tablet, TAKE 1 TABLET BY MOUTH 2 TIMES A DAY WITH MEAL.  Current Outpatient Medications (Cardiovascular):  .  lisinopril-hydrochlorothiazide (ZESTORETIC) 20-12.5 MG tablet, Take 1 tablet by mouth once daily .  nitroGLYCERIN (NITRODUR - DOSED IN MG/24 HR) 0.2 mg/hr patch, Cut and apply 1/4 patch to most painful area q24h.  Current Outpatient Medications (Respiratory):  .  levocetirizine (XYZAL) 5 MG tablet, Take 1 tablet (5 mg total) by mouth every evening.  Current Outpatient Medications (Analgesics):  .  ibuprofen (ADVIL) 800 MG tablet, Take 1 tablet (800 mg total) by mouth every 8 (eight) hours as needed. .  traMADol (ULTRAM) 50 MG tablet, Take 1 tablet (50 mg total) by mouth every 6 (six) hours  as needed.   Current Outpatient Medications (Other):  Marland Kitchen  ACCU-CHEK FASTCLIX LANCETS MISC, USE TO CHECK BLOOD SUGARS ONCE DAILY .  cyclobenzaprine (FLEXERIL) 5 MG tablet, Take 1 tablet (5 mg total) by mouth 3 (three) times daily as needed for muscle spasms. .  Diclofenac Sodium (PENNSAID) 2 % SOLN, Place 1 application onto the skin 2 (two) times daily. Marland Kitchen  glucose blood (ONETOUCH VERIO) test strip, 2x daily .  Lancets Misc. (ONE TOUCH SURESOFT) MISC, Use 1 lancet per test. Test blood sugars 1-4 times per day as instructed. .  Vitamin D, Ergocalciferol, (DRISDOL) 1.25 MG (50000 UT) CAPS capsule, Take 1 capsule (50,000 Units total) by mouth every 7 (seven) days.    Past medical history, social, surgical and family history all reviewed in electronic medical record.  No pertanent information unless stated regarding to the chief complaint.   Review of Systems:  No headache, visual changes, nausea, vomiting, diarrhea, constipation, dizziness, abdominal pain, skin rash, fevers, chills, night sweats, weight loss, swollen lymph nodes, body aches, joint swelling, , chest pain, shortness of breath, mood changes.  Positive muscle aches  Objective  Blood pressure 116/68, pulse 95, height 5\' 8"  (1.727 m), weight 266 lb (120.7 kg), SpO2 99 %.    General: No apparent distress alert and oriented x3 mood and affect normal, dressed appropriately.  HEENT: Pupils equal, extraocular movements intact  Respiratory: Patient's speak in full sentences and does not appear short of breath  Cardiovascular: No lower extremity edema, non tender, no erythema  Skin: Warm dry intact with no signs of infection or rash on extremities or on axial skeleton.  Abdomen: Soft nontender  Neuro: Cranial nerves II through XII are intact, neurovascularly intact in all extremities with 2+ DTRs and 2+ pulses.  Lymph: No lymphadenopathy of posterior or anterior cervical chain or axillae bilaterally.  Gait normal with good balance and  coordination.  MSK:  Non tender with full range of motion and good stability and symmetric strength and tone of elbows, wrist, hip, knee bilaterally.  Shoulder: Bilateral Inspection reveals no abnormalities, atrophy or asymmetry. ROM is full in all planes. Rotator cuff strength normal throughout. No signs of impingement with negative Neer and Hawkin's tests, empty can sign. Speeds and Yergason's tests normal. No labral pathology noted with negative Obrien's, negative clunk and good stability.  Positive crossover with mild discomfort of the acromioclavicular joint Normal scapular function observed. No painful arc and no drop arm sign. No apprehension sign  Ankle exams bilaterally does show the patient has overpronation of the hindfoot.  Still some mild tenderness over the Achilles tendon.  Patient does have pes planus noted as well.  Tightness of the posterior cord but no erythema.  Negative Thompson.  Impression and Recommendations:     This case required medical decision making of moderate complexity. The above documentation has been reviewed and is accurate and complete Lyndal Pulley, DO       Note: This dictation was prepared with Dragon dictation along with smaller phrase technology. Any transcriptional errors that result from this process are unintentional.

## 2018-12-25 ENCOUNTER — Encounter: Payer: Self-pay | Admitting: Family Medicine

## 2018-12-25 ENCOUNTER — Other Ambulatory Visit: Payer: Self-pay

## 2018-12-25 ENCOUNTER — Ambulatory Visit (INDEPENDENT_AMBULATORY_CARE_PROVIDER_SITE_OTHER): Payer: BC Managed Care – PPO | Admitting: Family Medicine

## 2018-12-25 VITALS — BP 116/68 | HR 95 | Ht 68.0 in | Wt 266.0 lb

## 2018-12-25 DIAGNOSIS — M7661 Achilles tendinitis, right leg: Secondary | ICD-10-CM | POA: Diagnosis not present

## 2018-12-25 DIAGNOSIS — M25512 Pain in left shoulder: Secondary | ICD-10-CM

## 2018-12-25 DIAGNOSIS — M25511 Pain in right shoulder: Secondary | ICD-10-CM

## 2018-12-25 DIAGNOSIS — M722 Plantar fascial fibromatosis: Secondary | ICD-10-CM

## 2018-12-25 DIAGNOSIS — M7662 Achilles tendinitis, left leg: Secondary | ICD-10-CM

## 2018-12-25 NOTE — Assessment & Plan Note (Signed)
Patient still has some mild pain but nothing severe.  Discussed with patient and just using topical anti-inflammatories at night to visit the only time it seems to give her some difficulty.  Discussed proper sleeping position as well as proper pillows.  Patient will try in the easy interventions first.  Discussed the possibility of formal physical therapy which patient declined.

## 2018-12-25 NOTE — Patient Instructions (Addendum)
New Balance greater than 700 (990 would be best) Gravity Defyer shoes Pennsaid nightly Ice for 20 minutes nightly Shoes in the house Keep hands in peripheral vision See me in 6-8 weeks for another injection if needed

## 2018-12-25 NOTE — Assessment & Plan Note (Addendum)
Patient has not made any significant improvement.  Patient continues to have difficulty secondary to her feet.  Encouraged her to do the exercises on a regular basis, continue the bracing, icing, topical anti-inflammatories and nitroglycerin patches.  This will likely take him longer.  Encourage patient to get better shoes.  We will see how patient does and follow-up with me again 6 to 8 weeks.  Patient once again declined formal physical therapy spent  25 minutes with patient face-to-face and had greater than 50% of counseling including as described above in assessment and plan.

## 2019-01-22 ENCOUNTER — Telehealth: Payer: Self-pay | Admitting: Internal Medicine

## 2019-01-22 NOTE — Telephone Encounter (Signed)
I have no idea to what the patient is asking since there are NO employers currently able to give a COVID vaccine.

## 2019-01-22 NOTE — Telephone Encounter (Signed)
Copied from [place] [id number]. Topic: General - Other >> Jan 22, 2019  9:46 AM Keene Breath wrote: Reason for CRM: Patient would like the nurse to call her to advise if she should take the COVID vaccine that her employer is offering.  She stated that she has high BP, diabetes and asthma.  Please call to discuss at [phone]

## 2019-01-23 NOTE — Telephone Encounter (Signed)
Patient advised ok to get vaccine per dr Gwynn Burly recommendation below

## 2019-01-23 NOTE — Telephone Encounter (Signed)
Medical screening examination/treatment/procedure(s) were performed by non-physician practitioner and as supervising physician I was immediately available for consultation/collaboration. I agree with above. Nayab Aten, MD   

## 2019-01-23 NOTE — Telephone Encounter (Signed)
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 She would like to know if she should get it or not. Please advise.

## 2019-01-23 NOTE — Telephone Encounter (Signed)
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

## 2019-01-23 NOTE — Telephone Encounter (Signed)
Pt returned call. Pt is very upset and says that the person that called her was very rude. Pt says the person hung up on her because she asked them to repeat themselves. Pt says that she is African so she couldn't understand clearly. Pt is crying and asked that providers CMA not call her phone again. Pt would like to communicate with someone else going forward because of incident.    In regards to covid vaccine, pt says that her job is able to order vaccine if she is able to have it. She would like to know if she can due to health history, if so  she can have them to place order.    Please advise.

## 2019-02-05 ENCOUNTER — Ambulatory Visit: Payer: BC Managed Care – PPO | Admitting: Family Medicine

## 2019-03-01 ENCOUNTER — Ambulatory Visit: Payer: BC Managed Care – PPO | Admitting: Internal Medicine

## 2019-03-01 DIAGNOSIS — I10 Essential (primary) hypertension: Secondary | ICD-10-CM

## 2019-03-02 ENCOUNTER — Encounter: Payer: Self-pay | Admitting: Internal Medicine

## 2019-03-02 NOTE — Patient Instructions (Signed)
None - pt no show

## 2019-03-02 NOTE — Progress Notes (Signed)
No show as pt did not answer pre doxy call per staff or present for doxy appt

## 2019-03-06 ENCOUNTER — Telehealth: Payer: Self-pay

## 2019-03-06 ENCOUNTER — Other Ambulatory Visit: Payer: Self-pay | Admitting: Internal Medicine

## 2019-03-06 DIAGNOSIS — I10 Essential (primary) hypertension: Secondary | ICD-10-CM

## 2019-03-06 NOTE — Telephone Encounter (Signed)
Please refill as per office routine med refill policy (all routine meds refilled for 3 mo or monthly per pt preference up to one year from last visit, then month to month grace period for 3 mo, then further med refills will have to be denied)  

## 2019-03-06 NOTE — Telephone Encounter (Signed)
New message     1. Which medications need to be refilled? (please list name of each medication and dose if known) lisinopril-hydrochlorothiazide (ZESTORETIC) 20-12.5 MG tablet  2. Which pharmacy/location (including street and city if local pharmacy) is medication to be sent to? [facility] [place]   3. Do they need a 30 day or 90 day supply? 90 day supply

## 2019-03-08 NOTE — Telephone Encounter (Signed)
Duplicate request received request from pharmacy already done.Marland KitchenRaechel Chute

## 2019-03-11 ENCOUNTER — Telehealth: Payer: Self-pay | Admitting: Internal Medicine

## 2019-03-11 NOTE — Telephone Encounter (Signed)
Called patient LVM, requested medication has been sent to [facility] at [facility]

## 2019-03-11 NOTE — Telephone Encounter (Signed)
Is requesting refill on lisinopril - hydrochlorothiazide to be sent to [facility] at [employer].

## 2019-03-12 ENCOUNTER — Other Ambulatory Visit: Payer: Self-pay

## 2019-03-13 ENCOUNTER — Encounter: Payer: Self-pay | Admitting: Women's Health

## 2019-03-13 ENCOUNTER — Ambulatory Visit: Payer: BC Managed Care – PPO | Admitting: Women's Health

## 2019-03-13 VITALS — BP 126/80 | Ht 68.0 in | Wt 264.0 lb

## 2019-03-13 DIAGNOSIS — Z01419 Encounter for gynecological examination (general) (routine) without abnormal findings: Secondary | ICD-10-CM

## 2019-03-13 DIAGNOSIS — Z30432 Encounter for removal of intrauterine contraceptive device: Secondary | ICD-10-CM

## 2019-03-13 NOTE — Progress Notes (Signed)
Samantha Ayers [birth year] [id number]    History:    Presents for annual exam.  2010 ParaGard IUD, monthly cycle, last here 2018.  Not sexually active in 4 years.  Normal Pap and mammogram history.  Primary care manages hypertension, diabetes, on 3 different medications.  Past medical history, past surgical history, family history and social history were all reviewed and documented in the EPIC chart.  Works at [employer] in housekeeping.  Daughters 11 and 22,  8 oldest 2 with type 2 diabetes.  Originally from [place], husband went to visit in 2017 and did not return.  ROS:  A ROS was performed and pertinent positives and negatives are included.  Exam:  Vitals:   03/13/19 1225  BP: 126/80  Weight: 264 lb (119.7 kg)  Height: 5\' 8"  (1.727 m)   Body mass index is 40.14 kg/m.   General appearance:  Normal Thyroid:  Symmetrical, normal in size, without palpable masses or nodularity. Respiratory  Auscultation:  Clear without wheezing or rhonchi Cardiovascular  Auscultation:  Regular rate, without rubs, murmurs or gallops  Edema/varicosities:  Not grossly evident Abdominal  Soft,nontender, without masses, guarding or rebound.  Liver/spleen:  No organomegaly noted  Hernia:  None appreciated  Skin  Inspection:  Grossly normal   Breasts: Examined lying and sitting.     Right: Without masses, retractions, discharge or axillary adenopathy.     Left: Without masses, retractions, discharge or axillary adenopathy. Gentitourinary   Inguinal/mons:  Normal without inguinal adenopathy  External genitalia:  Normal  BUS/Urethra/Skene's glands:  Normal  Vagina:  Normal  Cervix:  Normal IUD string grasped removed intact shown to patient and discarded  Uterus:  normal in size, shape and contour.  Midline and mobile  Adnexa/parametria:     Rt: Without masses or tenderness.   Lt: Without masses or tenderness.  Anus and perineum: Normal  Digital rectal exam: Normal sphincter tone without  palpated masses or tenderness  Assessment/Plan:  49 y.o. SBF G3 P3 for annual exam with no complaints.  Regular monthly cycle not sexually active-ParaGard IUD removed  Hypertension, diabetes-primary care manages labs and meds Obesity  Plan: Contraception reviewed and declined need, will return to office for IUD if becomes sexually active.  SBEs, annual screening mammogram  due instructed to schedule, calcium rich foods, vitamin D 2000 IUs daily encouraged.  Aware of importance of increasing exercise - daily brisk walk , decreasing calories/carbs encouraged.  11/2016 Pap normal, new screening guidelines reviewed.    12/2016 [facility], 12:31 PM 03/13/2019

## 2019-03-13 NOTE — Patient Instructions (Addendum)
It was good to see you today Vitamin D 2000 IUs daily Health Maintenance, Female Adopting a healthy lifestyle and getting preventive care are important in promoting health and wellness. Ask your health care provider about:  The right schedule for you to have regular tests and exams.  Things you can do on your own to prevent diseases and keep yourself healthy. What should I know about diet, weight, and exercise? Eat a healthy diet   Eat a diet that includes plenty of vegetables, fruits, low-fat dairy products, and lean protein.  Do not eat a lot of foods that are high in solid fats, added sugars, or sodium. Maintain a healthy weight Body mass index (BMI) is used to identify weight problems. It estimates body fat based on height and weight. Your health care provider can help determine your BMI and help you achieve or maintain a healthy weight. Get regular exercise Get regular exercise. This is one of the most important things you can do for your health. Most adults should:  Exercise for at least 150 minutes each week. The exercise should increase your heart rate and make you sweat (moderate-intensity exercise).  Do strengthening exercises at least twice a week. This is in addition to the moderate-intensity exercise.  Spend less time sitting. Even light physical activity can be beneficial. Watch cholesterol and blood lipids Have your blood tested for lipids and cholesterol at 47 years of age, then have this test every 5 years. Have your cholesterol levels checked more often if:  Your lipid or cholesterol levels are high.  You are older than 47 years of age.  You are at high risk for heart disease. What should I know about cancer screening? Depending on your health history and family history, you may need to have cancer screening at various ages. This may include screening for:  Breast cancer.  Cervical cancer.  Colorectal cancer.  Skin cancer.  Lung cancer. What should I  know about heart disease, diabetes, and high blood pressure? Blood pressure and heart disease  High blood pressure causes heart disease and increases the risk of stroke. This is more likely to develop in people who have high blood pressure readings, are of African descent, or are overweight.  Have your blood pressure checked: ? Every 3-5 years if you are 18-39 years of age. ? Every year if you are 40 years old or older. Diabetes Have regular diabetes screenings. This checks your fasting blood sugar level. Have the screening done:  Once every three years after age 40 if you are at a normal weight and have a low risk for diabetes.  More often and at a younger age if you are overweight or have a high risk for diabetes. What should I know about preventing infection? Hepatitis B If you have a higher risk for hepatitis B, you should be screened for this virus. Talk with your health care provider to find out if you are at risk for hepatitis B infection. Hepatitis C Testing is recommended for:  Everyone born from 1945 through 1965.  Anyone with known risk factors for hepatitis C. Sexually transmitted infections (STIs)  Get screened for STIs, including gonorrhea and chlamydia, if: ? You are sexually active and are younger than 47 years of age. ? You are older than 47 years of age and your health care provider tells you that you are at risk for this type of infection. ? Your sexual activity has changed since you were last screened, and you are at   increased risk for chlamydia or gonorrhea. Ask your health care provider if you are at risk.  Ask your health care provider about whether you are at high risk for HIV. Your health care provider may recommend a prescription medicine to help prevent HIV infection. If you choose to take medicine to prevent HIV, you should first get tested for HIV. You should then be tested every 3 months for as long as you are taking the medicine. Pregnancy  If you are  about to stop having your period (premenopausal) and you may become pregnant, seek counseling before you get pregnant.  Take 400 to 800 micrograms (mcg) of folic acid every day if you become pregnant.  Ask for birth control (contraception) if you want to prevent pregnancy. Osteoporosis and menopause Osteoporosis is a disease in which the bones lose minerals and strength with aging. This can result in bone fractures. If you are 65 years old or older, or if you are at risk for osteoporosis and fractures, ask your health care provider if you should:  Be screened for bone loss.  Take a calcium or vitamin D supplement to lower your risk of fractures.  Be given hormone replacement therapy (HRT) to treat symptoms of menopause. Follow these instructions at home: Lifestyle  Do not use any products that contain nicotine or tobacco, such as cigarettes, e-cigarettes, and chewing tobacco. If you need help quitting, ask your health care provider.  Do not use street drugs.  Do not share needles.  Ask your health care provider for help if you need support or information about quitting drugs. Alcohol use  Do not drink alcohol if: ? Your health care provider tells you not to drink. ? You are pregnant, may be pregnant, or are planning to become pregnant.  If you drink alcohol: ? Limit how much you use to 0-1 drink a day. ? Limit intake if you are breastfeeding.  Be aware of how much alcohol is in your drink. In the [place] one drink equals one 12 oz bottle of beer (355 mL), one 5 oz glass of wine (148 mL), or one 1 oz glass of hard liquor (44 mL). General instructions  Schedule regular health, dental, and eye exams.  Stay current with your vaccines.  Tell your health care provider if: ? You often feel depressed. ? You have ever been abused or do not feel safe at home. Summary  Adopting a healthy lifestyle and getting preventive care are important in promoting health and wellness.  Follow  your health care provider's instructions about healthy diet, exercising, and getting tested or screened for diseases.  Follow your health care provider's instructions on monitoring your cholesterol and blood pressure. This information is not intended to replace advice given to you by your health care provider. Make sure you discuss any questions you have with your health care provider. Document Revised: 01/17/2018 Document Reviewed: 01/17/2018 Elsevier Patient Education  2020 Elsevier Inc.  Carbohydrate Counting for Diabetes Mellitus, Adult  Carbohydrate counting is a method of keeping track of how many carbohydrates you eat. Eating carbohydrates naturally increases the amount of sugar (glucose) in the blood. Counting how many carbohydrates you eat helps keep your blood glucose within normal limits, which helps you manage your diabetes (diabetes mellitus). It is important to know how many carbohydrates you can safely have in each meal. This is different for every person. A diet and nutrition specialist (registered dietitian) can help you make a meal plan and calculate how many carbohydrates you   should have at each meal and snack. Carbohydrates are found in the following foods:  Grains, such as breads and cereals.  Dried beans and soy products.  Starchy vegetables, such as potatoes, peas, and corn.  Fruit and fruit juices.  Milk and yogurt.  Sweets and snack foods, such as cake, cookies, candy, chips, and soft drinks. How do I count carbohydrates? There are two ways to count carbohydrates in food. You can use either of the methods or a combination of both. Reading "Nutrition Facts" on packaged food The "Nutrition Facts" list is included on the labels of almost all packaged foods and beverages in the [place] It includes:  The serving size.  Information about nutrients in each serving, including the grams (g) of carbohydrate per serving. To use the "Nutrition Facts":  Decide how many  servings you will have.  Multiply the number of servings by the number of carbohydrates per serving.  The resulting number is the total amount of carbohydrates that you will be having. Learning standard serving sizes of other foods When you eat carbohydrate foods that are not packaged or do not include "Nutrition Facts" on the label, you need to measure the servings in order to count the amount of carbohydrates:  Measure the foods that you will eat with a food scale or measuring cup, if needed.  Decide how many standard-size servings you will eat.  Multiply the number of servings by 15. Most carbohydrate-rich foods have about 15 g of carbohydrates per serving. ? For example, if you eat 8 oz (170 g) of strawberries, you will have eaten 2 servings and 30 g of carbohydrates (2 servings x 15 g = 30 g).  For foods that have more than one food mixed, such as soups and casseroles, you must count the carbohydrates in each food that is included. The following list contains standard serving sizes of common carbohydrate-rich foods. Each of these servings has about 15 g of carbohydrates:   hamburger bun or  English muffin.   oz (15 mL) syrup.   oz (14 g) jelly.  1 slice of bread.  1 six-inch tortilla.  3 oz (85 g) cooked rice or pasta.  4 oz (113 g) cooked dried beans.  4 oz (113 g) starchy vegetable, such as peas, corn, or potatoes.  4 oz (113 g) hot cereal.  4 oz (113 g) mashed potatoes or  of a large baked potato.  4 oz (113 g) canned or frozen fruit.  4 oz (120 mL) fruit juice.  4-6 crackers.  6 chicken nuggets.  6 oz (170 g) unsweetened dry cereal.  6 oz (170 g) plain fat-free yogurt or yogurt sweetened with artificial sweeteners.  8 oz (240 mL) milk.  8 oz (170 g) fresh fruit or one small piece of fruit.  24 oz (680 g) popped popcorn. Example of carbohydrate counting Sample meal  3 oz (85 g) chicken breast.  6 oz (170 g) brown rice.  4 oz (113 g)  corn.  8 oz (240 mL) milk.  8 oz (170 g) strawberries with sugar-free whipped topping. Carbohydrate calculation 1. Identify the foods that contain carbohydrates: ? Rice. ? Corn. ? Milk. ? Strawberries. 2. Calculate how many servings you have of each food: ? 2 servings rice. ? 1 serving corn. ? 1 serving milk. ? 1 serving strawberries. 3. Multiply each number of servings by 15 g: ? 2 servings rice x 15 g = 30 g. ? 1 serving corn x 15 g = 15   g. ? 1 serving milk x 15 g = 15 g. ? 1 serving strawberries x 15 g = 15 g. 4. Add together all of the amounts to find the total grams of carbohydrates eaten: ? 30 g + 15 g + 15 g + 15 g = 75 g of carbohydrates total. Summary  Carbohydrate counting is a method of keeping track of how many carbohydrates you eat.  Eating carbohydrates naturally increases the amount of sugar (glucose) in the blood.  Counting how many carbohydrates you eat helps keep your blood glucose within normal limits, which helps you manage your diabetes.  A diet and nutrition specialist (registered dietitian) can help you make a meal plan and calculate how many carbohydrates you should have at each meal and snack. This information is not intended to replace advice given to you by your health care provider. Make sure you discuss any questions you have with your health care provider. Document Revised: 08/18/2016 Document Reviewed: 07/08/2015 Elsevier Patient Education  2020 Elsevier Inc.  

## 2019-03-14 ENCOUNTER — Other Ambulatory Visit: Payer: Self-pay | Admitting: Women's Health

## 2019-03-14 DIAGNOSIS — Z1231 Encounter for screening mammogram for malignant neoplasm of breast: Secondary | ICD-10-CM

## 2019-04-14 ENCOUNTER — Other Ambulatory Visit: Payer: Self-pay | Admitting: Internal Medicine

## 2019-04-14 DIAGNOSIS — E119 Type 2 diabetes mellitus without complications: Secondary | ICD-10-CM

## 2019-04-14 NOTE — Telephone Encounter (Signed)
Please refill as per office routine med refill policy (all routine meds refilled for 3 mo or monthly per pt preference up to one year from last visit, then month to month grace period for 3 mo, then further med refills will have to be denied)  

## 2019-04-18 ENCOUNTER — Ambulatory Visit
Admission: RE | Admit: 2019-04-18 | Discharge: 2019-04-18 | Disposition: A | Discharge status: Home / Self Care | Payer: BC Managed Care – PPO | Source: Ambulatory Visit | Attending: Women's Health | Admitting: Women's Health

## 2019-04-18 ENCOUNTER — Other Ambulatory Visit: Payer: Self-pay

## 2019-04-18 DIAGNOSIS — Z1231 Encounter for screening mammogram for malignant neoplasm of breast: Secondary | ICD-10-CM

## 2019-09-26 ENCOUNTER — Other Ambulatory Visit: Payer: Self-pay | Admitting: Internal Medicine

## 2019-09-26 DIAGNOSIS — I10 Essential (primary) hypertension: Secondary | ICD-10-CM

## 2019-09-26 DIAGNOSIS — E119 Type 2 diabetes mellitus without complications: Secondary | ICD-10-CM

## 2019-12-01 ENCOUNTER — Other Ambulatory Visit: Payer: Self-pay | Admitting: Internal Medicine

## 2019-12-01 DIAGNOSIS — E119 Type 2 diabetes mellitus without complications: Secondary | ICD-10-CM

## 2019-12-01 DIAGNOSIS — I10 Essential (primary) hypertension: Secondary | ICD-10-CM

## 2019-12-01 NOTE — Telephone Encounter (Signed)
Please refill as per office routine med refill policy (all routine meds refilled for 3 mo or monthly per pt preference up to one year from last visit, then month to month grace period for 3 mo, then further med refills will have to be denied)  

## 2020-01-13 ENCOUNTER — Ambulatory Visit: Payer: BC Managed Care – PPO | Admitting: Obstetrics and Gynecology

## 2020-01-14 ENCOUNTER — Ambulatory Visit: Payer: BC Managed Care – PPO | Admitting: Obstetrics and Gynecology

## 2020-01-14 ENCOUNTER — Other Ambulatory Visit: Payer: Self-pay

## 2020-01-14 ENCOUNTER — Encounter: Payer: Self-pay | Admitting: Obstetrics and Gynecology

## 2020-01-14 VITALS — BP 124/80

## 2020-01-14 DIAGNOSIS — N898 Other specified noninflammatory disorders of vagina: Secondary | ICD-10-CM | POA: Diagnosis not present

## 2020-01-14 DIAGNOSIS — L292 Pruritus vulvae: Secondary | ICD-10-CM

## 2020-01-14 MED ORDER — FLUCONAZOLE 150 MG PO TABS: 150.0000 mg | ORAL_TABLET | ORAL | 0 refills | Status: AC

## 2020-01-14 MED ORDER — NYSTATIN-TRIAMCINOLONE 100000-0.1 UNIT/GM-% EX OINT: 1.0000 "application " | TOPICAL_OINTMENT | Freq: Two times a day (BID) | CUTANEOUS | 0 refills | Status: AC

## 2020-01-14 NOTE — Progress Notes (Signed)
Janmarie Smoot [birth year] [id number]  SUBJECTIVE:  49 y.o. G3P3 female presents for 2 weeks of vulvar/vaginal itching and irritation, mostly on the outside but also side the vagina.  No sniffing and vaginal discharge.  No vaginal bleeding.  She is having a regular menstrual period.  She did try about 5 days of Monistat vaginal cream which did not really help her symptoms.  Current Outpatient Medications  Medication Sig Dispense Refill  . ACCU-CHEK FASTCLIX LANCETS MISC USE TO CHECK BLOOD SUGARS ONCE DAILY  0  . cyclobenzaprine (FLEXERIL) 5 MG tablet Take 1 tablet (5 mg total) by mouth 3 (three) times daily as needed for muscle spasms. 30 tablet 0  . Diclofenac Sodium (PENNSAID) 2 % SOLN Place 1 application onto the skin 2 (two) times daily. 1 Bottle 1  . glipiZIDE (GLUCOTROL XL) 5 MG 24 hr tablet Take 1 tablet (5 mg total) by mouth daily with breakfast. 90 tablet 3  . glucose blood (ONETOUCH VERIO) test strip 2x daily 100 each 12  . ibuprofen (ADVIL) 800 MG tablet Take 1 tablet (800 mg total) by mouth every 8 (eight) hours as needed. 30 tablet 0  . Lancets Misc. (ONE TOUCH SURESOFT) MISC Use 1 lancet per test. Test blood sugars 1-4 times per day as instructed. 1 each 1  . levocetirizine (XYZAL) 5 MG tablet Take 1 tablet (5 mg total) by mouth every evening. 90 tablet 3  . lisinopril-hydrochlorothiazide (ZESTORETIC) 20-12.5 MG tablet Take 1 tablet by mouth once daily 90 tablet 0  . metFORMIN (GLUCOPHAGE) 1000 MG tablet TAKE 1 TABLET BY MOUTH TWICE DAILY WITH A MEAL ANNUAL  APPT  DUE  IN  JUNE  MUST  SEE  PROVIDER  FOR  FURTHER  REFILLS 180 tablet 0  . nitroGLYCERIN (NITRODUR - DOSED IN MG/24 HR) 0.2 mg/hr patch Cut and apply 1/4 patch to most painful area q24h. 30 patch 11  . traMADol (ULTRAM) 50 MG tablet Take 1 tablet (50 mg total) by mouth every 6 (six) hours as needed. 30 tablet 0  . TRULICITY 1.5 MG/0.5ML SOPN INJECT 1.5 MG  SUBCUTANEOUSLY ONCE A WEEK 12 mL 0  . Vitamin D,  Ergocalciferol, (DRISDOL) 1.25 MG (50000 UT) CAPS capsule Take 1 capsule (50,000 Units total) by mouth every 7 (seven) days. 12 capsule 0   No current facility-administered medications for this visit.   Allergies: Patient has no known allergies.  Patient's last menstrual period was 12/31/2019.  Past medical history,surgical history, problem list, medications, allergies, family history and social history were all reviewed and documented as reviewed in the EPIC chart.  ROS: Pertinent positives and negatives as reviewed in HPI  OBJECTIVE:  BP 124/80   LMP 12/31/2019  The patient appears well, alert, oriented, in no distress. PELVIC EXAM: VULVA: normal appearing vulva with general mild duskiness and mild thickening/lichenification, no masses, tenderness or lesions, VAGINA: normal appearing vagina with normal color and discharge, no lesions, CERVIX: normal appearing cervix without discharge or lesions, WET MOUNT done - results: negative for pathogens, normal epithelial cells  Chaperone: Kennon Portela present during the examination  ASSESSMENT:  49 y.o. G3P3 here for vulvovaginal pruritus/irritation  PLAN:  Given the history of diabetes I will empirically cover her for possible internal vaginal yeast infection with Diflucan 150 mg p.o., every 72 hours, x2 doses. I recommended Mycolog ointment applying it to the external vulvar structures only doing this twice a day for 1 week.  Wear gloves to apply/wash hands thoroughly after application.  She will let us know if her symptoms do not improve.   Theresia Majors MD 01/14/20

## 2020-01-15 LAB — WET PREP FOR TRICH, YEAST, CLUE

## 2020-01-17 ENCOUNTER — Telehealth: Payer: Self-pay

## 2020-01-17 NOTE — Telephone Encounter (Signed)
Patient said when she was in for visit that she mentioned pain in her side. She said she was told to use the medication prescribed and see if it improved.  It has not.  She said x 2 weeks she has had this pain. It is constant but worse at night when she lays down flat and also, when she turns over.  She is requesting an ultrasound to be sure everything is ok.  I explained Dr. Penni Bombard is off today and will return Monday and I will check with him and call her with his reply. She was agreeable to this.

## 2020-01-20 ENCOUNTER — Other Ambulatory Visit: Payer: Self-pay

## 2020-01-20 ENCOUNTER — Telehealth: Payer: Self-pay | Admitting: Internal Medicine

## 2020-01-20 ENCOUNTER — Ambulatory Visit: Payer: BC Managed Care – PPO | Admitting: Internal Medicine

## 2020-01-20 ENCOUNTER — Encounter: Payer: Self-pay | Admitting: Internal Medicine

## 2020-01-20 VITALS — BP 180/90 | HR 99 | Temp 98.6°F | Ht 68.0 in | Wt 270.0 lb

## 2020-01-20 DIAGNOSIS — Z1159 Encounter for screening for other viral diseases: Secondary | ICD-10-CM | POA: Diagnosis not present

## 2020-01-20 DIAGNOSIS — E785 Hyperlipidemia, unspecified: Secondary | ICD-10-CM | POA: Diagnosis not present

## 2020-01-20 DIAGNOSIS — Z23 Encounter for immunization: Secondary | ICD-10-CM | POA: Diagnosis not present

## 2020-01-20 DIAGNOSIS — Z0001 Encounter for general adult medical examination with abnormal findings: Secondary | ICD-10-CM | POA: Diagnosis not present

## 2020-01-20 DIAGNOSIS — I10 Essential (primary) hypertension: Secondary | ICD-10-CM | POA: Diagnosis not present

## 2020-01-20 DIAGNOSIS — E1165 Type 2 diabetes mellitus with hyperglycemia: Secondary | ICD-10-CM

## 2020-01-20 DIAGNOSIS — R102 Pelvic and perineal pain: Secondary | ICD-10-CM

## 2020-01-20 MED ORDER — LISINOPRIL-HYDROCHLOROTHIAZIDE 20-12.5 MG PO TABS: 2.0000 | ORAL_TABLET | Freq: Every day | ORAL | 3 refills | Status: DC

## 2020-01-20 NOTE — Assessment & Plan Note (Signed)

## 2020-01-20 NOTE — Telephone Encounter (Signed)
Ok she can schedule a pelvic ultrasound

## 2020-01-20 NOTE — Patient Instructions (Addendum)
Ok to take 2 pills of the lisinopril-HCT  You had the flu shot today  Please continue all other medications as before, and refills have been done if requested.  Please have the pharmacy call with any other refills you may need.  Please continue your efforts at being more active, low cholesterol diet, and weight control.  You are otherwise up to date with prevention measures today.  Please keep your appointments with your specialists as you may have planned  Please make an Appointment to return in 3 months

## 2020-01-20 NOTE — Assessment & Plan Note (Signed)
Uncontrolled, for increased lisiopril hct 20/12.5 - 2 per day, f/u BP at home and next visit

## 2020-01-20 NOTE — Telephone Encounter (Signed)
[facility]   Caller states her blood pressure and BP is 184/99 is high and her pulse is 98. Denies chest pain, SOB, or other sx  [facility] advised: See PCP within 24 Hours   Patient understood and complied.   Scheduled an appt for 01/20/20

## 2020-01-20 NOTE — Assessment & Plan Note (Signed)
Improved with last a1c, wt up a few lbs, declines fu a1c today, but will cont med and better DM diet

## 2020-01-20 NOTE — Assessment & Plan Note (Signed)
stable overall by history and exam, recent data reviewed with pt, and pt to continue medical treatment as before,  to f/u any worsening symptoms or concerns  

## 2020-01-20 NOTE — Telephone Encounter (Signed)
Order placed.Routed message to Lupita Leash to call patient and let her know Dr. Penni Bombard gave okay and schedule her appointment.

## 2020-01-20 NOTE — Progress Notes (Signed)
Subjective:    Patient ID: Samantha Ayers, female    DOB: [birth year], 49 y.o.   MRN: [id number]  HPI  Here for wellness and f/u;  Overall doing ok;  Pt denies Chest pain, worsening SOB, DOE, wheezing, orthopnea, PND, worsening LE edema, palpitations, dizziness or syncope.  Pt denies neurological change such as new headache, facial or extremity weakness.  Pt denies polydipsia, polyuria, or low sugar symptoms. Pt states overall good compliance with treatment and medications, good tolerability, and has been trying to follow appropriate diet.  Pt denies worsening depressive symptoms, suicidal ideation or panic. No fever, night sweats, wt loss, loss of appetite, or other constitutional symptoms.  Pt states good ability with ADL's, has low fall risk, home safety reviewed and adequate, no other significant changes in hearing or vision, and only occasionally active with exercise. BP have been elevated like this today several times at home and GYN recently  BP Readings from Last 3 Encounters:  01/20/20 (!) 180/90  01/14/20 124/80  03/13/19 126/80   Wt Readings from Last 3 Encounters:  01/20/20 270 lb (122.5 kg)  03/13/19 264 lb (119.7 kg)  12/25/18 266 lb (120.7 kg)   Past Medical History:  Diagnosis Date  . Allergy   . Asthma   . Diabetes mellitus   . Hypertension   . Obesity    History reviewed. No pertinent surgical history.  reports that she has never smoked. She has never used smokeless tobacco. She reports that she does not drink alcohol and does not use drugs. family history includes Diabetes in her father and mother. No Known Allergies Current Outpatient Medications on File Prior to Visit  Medication Sig Dispense Refill  . ACCU-CHEK FASTCLIX LANCETS MISC USE TO CHECK BLOOD SUGARS ONCE DAILY  0  . cyclobenzaprine (FLEXERIL) 5 MG tablet Take 1 tablet (5 mg total) by mouth 3 (three) times daily as needed for muscle spasms. 30 tablet 0  . Diclofenac Sodium (PENNSAID) 2 % SOLN  Place 1 application onto the skin 2 (two) times daily. 1 Bottle 1  . glipiZIDE (GLUCOTROL XL) 5 MG 24 hr tablet Take 1 tablet (5 mg total) by mouth daily with breakfast. 90 tablet 3  . glucose blood (ONETOUCH VERIO) test strip 2x daily 100 each 12  . ibuprofen (ADVIL) 800 MG tablet Take 1 tablet (800 mg total) by mouth every 8 (eight) hours as needed. 30 tablet 0  . Lancets Misc. (ONE TOUCH SURESOFT) MISC Use 1 lancet per test. Test blood sugars 1-4 times per day as instructed. 1 each 1  . levocetirizine (XYZAL) 5 MG tablet Take 1 tablet (5 mg total) by mouth every evening. 90 tablet 3  . metFORMIN (GLUCOPHAGE) 1000 MG tablet TAKE 1 TABLET BY MOUTH TWICE DAILY WITH A MEAL ANNUAL  APPT  DUE  IN  JUNE  MUST  SEE  PROVIDER  FOR  FURTHER  REFILLS 180 tablet 0  . nitroGLYCERIN (NITRODUR - DOSED IN MG/24 HR) 0.2 mg/hr patch Cut and apply 1/4 patch to most painful area q24h. 30 patch 11  . nystatin-triamcinolone ointment (MYCOLOG) Apply 1 application topically 2 (two) times daily for 7 days. Apply only to vulva/external vagina, do not put inside vagina 30 g 0  . traMADol (ULTRAM) 50 MG tablet Take 1 tablet (50 mg total) by mouth every 6 (six) hours as needed. 30 tablet 0  . TRULICITY 1.5 MG/0.5ML SOPN INJECT 1.5 MG  SUBCUTANEOUSLY ONCE A WEEK 12 mL 0   No  current facility-administered medications on file prior to visit.   Review of Systems All otherwise neg per pt    Objective:   Physical Exam BP (!) 180/90 (BP Location: Left Arm, Patient Position: Sitting, Cuff Size: Large)   Pulse 99   Temp 98.6 F (37 C) (Oral)   Ht 5\' 8"  (1.727 m)   Wt 270 lb (122.5 kg)   LMP 12/31/2019   SpO2 98%   BMI 41.05 kg/m  VS noted,  Constitutional: Pt appears in NAD HENT: Head: NCAT.  Right Ear: External ear normal.  Left Ear: External ear normal.  Eyes: . Pupils are equal, round, and reactive to light. Conjunctivae and EOM are normal Nose: without d/c or deformity Neck: Neck supple. Gross normal  ROM Cardiovascular: Normal rate and regular rhythm.   Pulmonary/Chest: Effort normal and breath sounds without rales or wheezing.  Abd:  Soft, NT, ND, + BS, no organomegaly Neurological: Pt is alert. At baseline orientation, motor grossly intact Skin: Skin is warm. No rashes, other new lesions, no LE edema Psychiatric: Pt behavior is normal without agitation  All otherwise neg per pt Lab Results  Component Value Date   WBC 9.8 10/29/2018   HGB 11.4 (L) 10/29/2018   HCT 33.7 (L) 10/29/2018   PLT 363.0 10/29/2018   GLUCOSE 243 (H) 10/29/2018   CHOL 158 10/29/2018   TRIG 355.0 (H) 10/29/2018   HDL 34.20 (L) 10/29/2018   LDLDIRECT 92.0 10/29/2018   LDLCALC 92 12/05/2016   ALT 14 10/29/2018   AST 11 10/29/2018   NA 134 (L) 10/29/2018   K 4.2 10/29/2018   CL 99 10/29/2018   CREATININE 0.69 10/29/2018   BUN 16 10/29/2018   CO2 24 10/29/2018   TSH 0.74 10/29/2018   HGBA1C 8.6 (H) 10/29/2018   MICROALBUR 1.0 10/29/2018      Assessment & Plan:

## 2020-01-21 NOTE — Addendum Note (Signed)
Addended by: Delsa Grana R on: 01/21/2020 08:53 AM   Modules accepted: Orders

## 2020-01-29 ENCOUNTER — Telehealth: Payer: Self-pay

## 2020-01-29 NOTE — Telephone Encounter (Signed)
If heavy menses then better to reschedule for another time of period

## 2020-01-29 NOTE — Telephone Encounter (Signed)
Patient has u/s scheduled tomorrow for pelvic pain. Patient is on her period and its heavy. She doesn't want to come and miss work and not be able to do it. Ok to proceed while she is have menses?

## 2020-01-29 NOTE — Telephone Encounter (Signed)
Spoke with patient and informed her. °

## 2020-01-30 ENCOUNTER — Other Ambulatory Visit: Payer: Self-pay

## 2020-01-30 ENCOUNTER — Ambulatory Visit (INDEPENDENT_AMBULATORY_CARE_PROVIDER_SITE_OTHER): Payer: BC Managed Care – PPO | Admitting: Obstetrics and Gynecology

## 2020-01-30 ENCOUNTER — Ambulatory Visit (INDEPENDENT_AMBULATORY_CARE_PROVIDER_SITE_OTHER): Payer: BC Managed Care – PPO

## 2020-01-30 ENCOUNTER — Encounter: Payer: Self-pay | Admitting: Obstetrics and Gynecology

## 2020-01-30 VITALS — BP 122/78

## 2020-01-30 DIAGNOSIS — R1030 Lower abdominal pain, unspecified: Secondary | ICD-10-CM

## 2020-01-30 DIAGNOSIS — R102 Pelvic and perineal pain: Secondary | ICD-10-CM

## 2020-01-30 DIAGNOSIS — N854 Malposition of uterus: Secondary | ICD-10-CM

## 2020-01-30 DIAGNOSIS — N852 Hypertrophy of uterus: Secondary | ICD-10-CM | POA: Diagnosis not present

## 2020-01-30 DIAGNOSIS — N8301 Follicular cyst of right ovary: Secondary | ICD-10-CM | POA: Diagnosis not present

## 2020-01-30 NOTE — Progress Notes (Addendum)
Seirra Kos Jun 20, 1970 [id number]  SUBJECTIVE:  49 y.o. G3P3 female presents for a pelvic ultrasound to evaluate lower midline abdominal/pelvic pain.  She had noticed this pain and brought this up at her encounter on 01/14/2020 was told to monitor but felt that the pain was intensifying so we decided to bring her in for a pelvic ultrasound.  Negative vaginal wet mount at her last visit 01/14/2020.  Then the pain gets better when she has her menstrual period.  She is just finishing her menstrual period for this month and currently is not having any abdominal pain.  She says she has had a normal 4-day period, not any heavier than normal.  No pain or cramping with the period.  No intermenstrual bleeding.  No dysuria.  Denies constipation.  Is not associated with movement, but seems to be a little worse when she lays on her stomach.   Current Outpatient Medications  Medication Sig Dispense Refill  . ACCU-CHEK FASTCLIX LANCETS MISC USE TO CHECK BLOOD SUGARS ONCE DAILY  0  . cyclobenzaprine (FLEXERIL) 5 MG tablet Take 1 tablet (5 mg total) by mouth 3 (three) times daily as needed for muscle spasms. 30 tablet 0  . Diclofenac Sodium (PENNSAID) 2 % SOLN Place 1 application onto the skin 2 (two) times daily. 1 Bottle 1  . glipiZIDE (GLUCOTROL XL) 5 MG 24 hr tablet Take 1 tablet (5 mg total) by mouth daily with breakfast. 90 tablet 3  . glucose blood (ONETOUCH VERIO) test strip 2x daily 100 each 12  . ibuprofen (ADVIL) 800 MG tablet Take 1 tablet (800 mg total) by mouth every 8 (eight) hours as needed. 30 tablet 0  . Lancets Misc. (ONE TOUCH SURESOFT) MISC Use 1 lancet per test. Test blood sugars 1-4 times per day as instructed. 1 each 1  . levocetirizine (XYZAL) 5 MG tablet Take 1 tablet (5 mg total) by mouth every evening. 90 tablet 3  . lisinopril-hydrochlorothiazide (ZESTORETIC) 20-12.5 MG tablet Take 2 tablets by mouth daily. 180 tablet 3  . metFORMIN (GLUCOPHAGE) 1000 MG tablet TAKE 1 TABLET  BY MOUTH TWICE DAILY WITH A MEAL ANNUAL  APPT  DUE  IN  JUNE  MUST  SEE  PROVIDER  FOR  FURTHER  REFILLS 180 tablet 0  . nitroGLYCERIN (NITRODUR - DOSED IN MG/24 HR) 0.2 mg/hr patch Cut and apply 1/4 patch to most painful area q24h. 30 patch 11  . traMADol (ULTRAM) 50 MG tablet Take 1 tablet (50 mg total) by mouth every 6 (six) hours as needed. 30 tablet 0  . TRULICITY 1.5 MG/0.5ML SOPN INJECT 1.5 MG  SUBCUTANEOUSLY ONCE A WEEK 12 mL 0   No current facility-administered medications for this visit.   Allergies: Patient has no known allergies.  Patient's last menstrual period was 01/29/2020.  Past medical history,surgical history, problem list, medications, allergies, family history and social history were all reviewed and documented as reviewed in the EPIC chart.   OBJECTIVE:  BP 122/78   LMP 01/29/2020  The patient appears well, alert, oriented x 3, in no distress.  Pelvic ultrasound Anteverted uterus 10.2 x 6.2 x 5.5 cm Generally bulky uterus but no myometrial masses/fibroids noted. Symmetrical endometrium 8.4 mm thickness. Bilateral ovaries normal in size.  Right ovary with two small follicles, one measuring 2.3 cm, second measuring 1.7 cm. No adnexal masses.  No free fluid.   ASSESSMENT:  49 y.o. G3P3 here for pelvic ultrasound to evaluate lower abdominal/pelvic pain.  PLAN:  I instructed  patient to keep track of her menstrual cycle and when she is having the pain in relation to cycle day.  Unusual that having her menstrual period would cause her abdominal pain to get better.  Her pelvic ultrasound is fairly unremarkable today other than a slightly bulky uterus without any obvious fibroids.  The endometrial lining is still somewhat thickened for having just gone through her menstrual period, so I question if she might have any endometrial polyps, but polyps are not usually associated with any abdominal/pelvic discomfort.  The normal ovulatory follicles in the right ovary are small in  size and would not explain the midline lower abdominal pain.  She has not had any menstrual period changes in intensity or duration.  Not having any dysmenorrhea symptoms so I question if this discomfort she is having is even related to her gynecologic organs or not.  I suggested trying using a heating pad if she has recurrence of the discomfort in the lower abdomen.   We will check a urinalysis today.   Will follow-up if she is continues to have the pain and notices a pattern and presentation of symptoms related to her menstrual cycle.  Theresia Majors MD 01/30/20

## 2020-02-03 LAB — URINALYSIS, COMPLETE W/RFL CULTURE: Hyaline Cast: NONE SEEN /LPF

## 2020-02-03 LAB — URINE CULTURE
MICRO NUMBER:: 11353093
SPECIMEN QUALITY:: ADEQUATE

## 2020-02-03 LAB — CULTURE INDICATED

## 2020-04-17 ENCOUNTER — Telehealth: Payer: Self-pay | Admitting: Internal Medicine

## 2020-04-17 MED ORDER — PROCTOFOAM HC 1-1 % EX FOAM: 1.0000 | Freq: Three times a day (TID) | CUTANEOUS | 0 refills | Status: DC

## 2020-04-17 NOTE — Telephone Encounter (Signed)
Patient has hemorrids and has tried to use OTC creams but it is not improving. She is requesting something to be called in to her pharmacy.   Preferred Pharmacy:  [facility] [address] [place]), [place] - [address] Phone:  [phone]  Fax:  [phone]

## 2020-04-17 NOTE — Telephone Encounter (Signed)
Patient notified of medication sent in

## 2020-04-17 NOTE — Telephone Encounter (Signed)
Will send in Proctofoam for patient; keep planned follow up with Dr. Jonny Ruiz to discuss possible GI referral.

## 2020-04-20 ENCOUNTER — Ambulatory Visit: Payer: BC Managed Care – PPO | Admitting: Internal Medicine

## 2020-05-04 ENCOUNTER — Other Ambulatory Visit: Payer: Self-pay

## 2020-05-05 ENCOUNTER — Ambulatory Visit: Payer: BC Managed Care – PPO | Admitting: Internal Medicine

## 2020-05-05 ENCOUNTER — Encounter: Payer: Self-pay | Admitting: Internal Medicine

## 2020-05-05 VITALS — BP 118/70 | HR 104 | Temp 98.0°F | Ht 68.0 in | Wt 257.0 lb

## 2020-05-05 DIAGNOSIS — E7800 Pure hypercholesterolemia, unspecified: Secondary | ICD-10-CM | POA: Diagnosis not present

## 2020-05-05 DIAGNOSIS — E119 Type 2 diabetes mellitus without complications: Secondary | ICD-10-CM

## 2020-05-05 DIAGNOSIS — I10 Essential (primary) hypertension: Secondary | ICD-10-CM

## 2020-05-05 DIAGNOSIS — E1165 Type 2 diabetes mellitus with hyperglycemia: Secondary | ICD-10-CM | POA: Diagnosis not present

## 2020-05-05 LAB — POCT GLYCOSYLATED HEMOGLOBIN (HGB A1C): Hemoglobin A1C: 11.6 % — AB (ref 4.0–5.6)

## 2020-05-05 MED ORDER — LISINOPRIL-HYDROCHLOROTHIAZIDE 20-12.5 MG PO TABS
2.0000 | ORAL_TABLET | Freq: Every day | ORAL | 3 refills | Status: DC
Start: 2020-05-05 — End: 2020-11-10

## 2020-05-05 MED ORDER — GLIPIZIDE ER 10 MG PO TB24: 10.0000 mg | ORAL_TABLET | Freq: Every day | ORAL | 3 refills | Status: DC

## 2020-05-05 MED ORDER — CYCLOBENZAPRINE HCL 5 MG PO TABS: 5.0000 mg | ORAL_TABLET | Freq: Three times a day (TID) | ORAL | 2 refills | Status: DC | PRN

## 2020-05-05 MED ORDER — TRULICITY 3 MG/0.5ML ~~LOC~~ SOAJ: SUBCUTANEOUS | 3 refills | Status: DC

## 2020-05-05 MED ORDER — METFORMIN HCL 1000 MG PO TABS: ORAL_TABLET | ORAL | 3 refills | Status: DC
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
## 2020-05-05 NOTE — Patient Instructions (Addendum)
Your A1c was 11.6  Ok to restart the glipzide ER at 10 mg per day  Ok to increase the trulicity to the 3 mg dosing  Please continue all other medications as before, including the metformin  Please have the pharmacy call with any other refills you may need.  Please continue your efforts at being more active, low cholesterol diet, and weight control.  You are otherwise up to date with prevention measures today.  Please keep your appointments with your specialists as you may have planned  Please make appt to return on Jan 20 2021 or later this year

## 2020-05-10 ENCOUNTER — Encounter: Payer: Self-pay | Admitting: Internal Medicine

## 2020-05-10 NOTE — Assessment & Plan Note (Signed)
BP Readings from Last 3 Encounters:  05/05/20 118/70  01/30/20 122/78  01/20/20 (!) 180/90   Stable, pt to continue medical treatment zestoretic  Current Outpatient Medications (Endocrine & Metabolic):  Marland Kitchen  Dulaglutide (TRULICITY) 3 MG/0.5ML SOPN, Take 0.5 ml subq injection once weekly .  glipiZIDE (GLUCOTROL XL) 10 MG 24 hr tablet, Take 1 tablet (10 mg total) by mouth daily with breakfast. .  metFORMIN (GLUCOPHAGE) 1000 MG tablet, TAKE 1 TABLET BY MOUTH TWICE DAILY WITH A MEAL  Current Outpatient Medications (Cardiovascular):  .  lisinopril-hydrochlorothiazide (ZESTORETIC) 20-12.5 MG tablet, Take 2 tablets by mouth daily.   Current Outpatient Medications (Analgesics):  .  ibuprofen (ADVIL) 800 MG tablet, Take 1 tablet (800 mg total) by mouth every 8 (eight) hours as needed.   Current Outpatient Medications (Other):  Marland Kitchen  ACCU-CHEK FASTCLIX LANCETS MISC, USE TO CHECK BLOOD SUGARS ONCE DAILY .  glucose blood (ONETOUCH VERIO) test strip, 2x daily .  hydrocortisone-pramoxine (PROCTOFOAM HC) rectal foam, Place 1 applicator rectally 3 (three) times daily. .  Lancets Misc. (ONE TOUCH SURESOFT) MISC, Use 1 lancet per test. Test blood sugars 1-4 times per day as instructed. .  cyclobenzaprine (FLEXERIL) 5 MG tablet, Take 1 tablet (5 mg total) by mouth 3 (three) times daily as needed for muscle spasms.

## 2020-05-10 NOTE — Assessment & Plan Note (Signed)
Goal ldl < 70, for lower cholesterol diet, delcines statin Lab Results  Component Value Date   LDLCALC 92 12/05/2016

## 2020-05-10 NOTE — Assessment & Plan Note (Addendum)
Out of meds for several months, a1c very high, d/w pt - to restart all meds including glucotrol, and increased trulicity to 3 mg dosing, declines DM education for now.

## 2020-07-09 ENCOUNTER — Other Ambulatory Visit: Payer: Self-pay | Admitting: Internal Medicine

## 2020-07-09 DIAGNOSIS — Z1231 Encounter for screening mammogram for malignant neoplasm of breast: Secondary | ICD-10-CM

## 2020-07-11 ENCOUNTER — Other Ambulatory Visit: Payer: Self-pay

## 2020-07-11 ENCOUNTER — Ambulatory Visit: Payer: BC Managed Care – PPO

## 2020-07-11 ENCOUNTER — Ambulatory Visit
Admission: RE | Admit: 2020-07-11 | Discharge: 2020-07-11 | Disposition: A | Discharge status: Home / Self Care | Payer: BC Managed Care – PPO | Source: Ambulatory Visit | Attending: Internal Medicine | Admitting: Internal Medicine

## 2020-07-11 DIAGNOSIS — Z1231 Encounter for screening mammogram for malignant neoplasm of breast: Secondary | ICD-10-CM

## 2020-08-21 ENCOUNTER — Ambulatory Visit (INDEPENDENT_AMBULATORY_CARE_PROVIDER_SITE_OTHER): Payer: BC Managed Care – PPO

## 2020-08-21 ENCOUNTER — Ambulatory Visit: Payer: BC Managed Care – PPO | Admitting: Internal Medicine

## 2020-08-21 ENCOUNTER — Other Ambulatory Visit: Payer: Self-pay

## 2020-08-21 DIAGNOSIS — J069 Acute upper respiratory infection, unspecified: Secondary | ICD-10-CM | POA: Diagnosis not present

## 2020-08-21 DIAGNOSIS — M25511 Pain in right shoulder: Secondary | ICD-10-CM

## 2020-08-21 DIAGNOSIS — M5442 Lumbago with sciatica, left side: Secondary | ICD-10-CM

## 2020-08-21 DIAGNOSIS — I10 Essential (primary) hypertension: Secondary | ICD-10-CM | POA: Diagnosis not present

## 2020-08-21 DIAGNOSIS — M5441 Lumbago with sciatica, right side: Secondary | ICD-10-CM

## 2020-08-21 DIAGNOSIS — M545 Low back pain, unspecified: Secondary | ICD-10-CM | POA: Insufficient documentation

## 2020-08-21 MED ORDER — CYCLOBENZAPRINE HCL 5 MG PO TABS: 5.0000 mg | ORAL_TABLET | Freq: Three times a day (TID) | ORAL | 1 refills | Status: DC | PRN

## 2020-08-21 MED ORDER — TRAMADOL HCL 50 MG PO TABS: 50.0000 mg | ORAL_TABLET | Freq: Four times a day (QID) | ORAL | 0 refills | Status: DC | PRN

## 2020-08-21 MED ORDER — AZITHROMYCIN 250 MG PO TABS: ORAL_TABLET | ORAL | 0 refills | Status: AC

## 2020-08-21 NOTE — Patient Instructions (Signed)
Please take all new medication as prescribed- the antibiotic, and please check COVID at home (or [facility] if you can)  Please take all new medication as prescribed  - the pain medication, and the muscle relaxer as needed  Please continue all other medications as before, and refills have been done if requested.  Please have the pharmacy call with any other refills you may need.  Please keep your appointments with your specialists as you may have planned  You will be contacted regarding the referral for: Sports medicine next week  You are given the Work Note today  Please go to the XRAY Department in the first floor for the x-ray testing  You will be contacted by phone if any changes need to be made immediately.  Otherwise, you will receive a letter about your results with an explanation, but please check with MyChart first.  Please remember to sign up for MyChart if you have not done so, as this will be important to you in the future with finding out test results, communicating by private email, and scheduling acute appointments online when needed.
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
## 2020-08-22 ENCOUNTER — Encounter: Payer: Self-pay | Admitting: Internal Medicine

## 2020-08-22 NOTE — Assessment & Plan Note (Signed)
BP Readings from Last 3 Encounters:  08/21/20 132/80  05/05/20 118/70  01/30/20 122/78   Now improved, pt to continue medical treatment lsiniopril hct
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
## 2020-08-22 NOTE — Assessment & Plan Note (Signed)
Mild to mod, for antibx course,  to f/u any worsening symptoms or concerns, and please to check home covid as well to confirm neg

## 2020-08-24 ENCOUNTER — Encounter: Payer: Self-pay | Admitting: Internal Medicine
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
## 2020-08-26 ENCOUNTER — Other Ambulatory Visit: Payer: Self-pay

## 2020-08-26 ENCOUNTER — Ambulatory Visit: Payer: BC Managed Care – PPO | Admitting: Family Medicine

## 2020-08-26 ENCOUNTER — Ambulatory Visit: Payer: Self-pay

## 2020-08-26 VITALS — BP 146/90 | HR 77 | Ht 68.0 in | Wt 248.6 lb

## 2020-08-26 DIAGNOSIS — M25511 Pain in right shoulder: Secondary | ICD-10-CM | POA: Diagnosis not present

## 2020-08-26 DIAGNOSIS — G5603 Carpal tunnel syndrome, bilateral upper limbs: Secondary | ICD-10-CM | POA: Diagnosis not present

## 2020-08-26 DIAGNOSIS — M62838 Other muscle spasm: Secondary | ICD-10-CM

## 2020-08-26 NOTE — Patient Instructions (Signed)
Thank you for coming in today.   I've referred you to Physical Therapy.  Let us know if you don't hear from them in one week.   Use Carpal Tunnel Wrist brace at night   Please go to [facility] supply to get the wrist brace we talked about today. You may also be able to get it from [employer].    Try heating pad.   Recheck in -4-6 weeks.   Let me know if this is not working.   Let me know if you need to extend your work note or you need forms for work.

## 2020-09-01 ENCOUNTER — Ambulatory Visit: Payer: BC Managed Care – PPO | Attending: Family Medicine | Admitting: Physical Therapy

## 2020-09-01 ENCOUNTER — Other Ambulatory Visit: Payer: Self-pay

## 2020-09-01 ENCOUNTER — Encounter: Payer: Self-pay | Admitting: Physical Therapy

## 2020-09-01 DIAGNOSIS — M6281 Muscle weakness (generalized): Secondary | ICD-10-CM | POA: Insufficient documentation

## 2020-09-01 DIAGNOSIS — M542 Cervicalgia: Secondary | ICD-10-CM | POA: Diagnosis not present
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
## 2020-09-02 NOTE — Patient Instructions (Signed)
Access Code: [email or web address] URL: [email or web address] Date: 09/02/2020 Prepared by: Alphonzo Severance  Exercises Seated Upper Trapezius Stretch - 1 x daily - 7 x weekly - 3 reps - 45 hold Seated Levator Scapulae Stretch - 1 x daily - 7 x weekly - 3 reps - 45 hold

## 2020-09-09 ENCOUNTER — Encounter: Payer: BC Managed Care – PPO | Admitting: Physical Therapy

## 2020-09-09 ENCOUNTER — Telehealth: Payer: Self-pay

## 2020-09-09 NOTE — Telephone Encounter (Signed)
PA for Tramadol 50MG  tab.  KEY: 

## 2020-09-15 ENCOUNTER — Encounter: Payer: BC Managed Care – PPO | Admitting: Physical Therapy

## 2020-09-17 ENCOUNTER — Encounter: Payer: BC Managed Care – PPO | Admitting: Physical Therapy

## 2020-09-22 ENCOUNTER — Ambulatory Visit: Payer: BC Managed Care – PPO | Admitting: Physical Therapy

## 2020-09-22 ENCOUNTER — Other Ambulatory Visit: Payer: Self-pay

## 2020-09-22 ENCOUNTER — Encounter: Payer: Self-pay | Admitting: Physical Therapy

## 2020-09-22 DIAGNOSIS — M542 Cervicalgia: Secondary | ICD-10-CM | POA: Insufficient documentation

## 2020-09-22 DIAGNOSIS — M6281 Muscle weakness (generalized): Secondary | ICD-10-CM | POA: Insufficient documentation

## 2020-09-22 NOTE — Therapy (Signed)
This note was created in error.

## 2020-09-24 ENCOUNTER — Encounter: Payer: BC Managed Care – PPO | Admitting: Physical Therapy

## 2020-09-29 ENCOUNTER — Ambulatory Visit: Payer: BC Managed Care – PPO | Admitting: Physical Therapy

## 2020-09-30 ENCOUNTER — Ambulatory Visit: Payer: BC Managed Care – PPO | Admitting: Family Medicine

## 2020-09-30 NOTE — Progress Notes (Deleted)
   I, [name], LAT, ATC acting as a scribe for [name], MD.  Samantha Ayers is a 50 y.o. female who presents to [facility] today for f/u B carpal tunnel syndrome, trapezius spasm, and R AC joint pain. Pt works as a Programmer, applications. Pt was last seen by Dr. Denyse Amass on 08/26/20 and was advised to use a heating pad, TENS unit, cyclobenzaprine, and wear cock-up wrist splints. Today, pt reports  Dx imaging: 08/21/20 R shoulder XR             06/19/03 L-spine XR  Pertinent review of systems: ***  Relevant historical information: ***   Exam:  There were no vitals taken for this visit. General: Well Developed, well nourished, and in no acute distress.   MSK: ***    Lab and Radiology Results No results found for this or any previous visit (from the past 72 hour(s)). No results found.     Assessment and Plan: 50 y.o. female with ***   PDMP not reviewed this encounter. No orders of the defined types were placed in this encounter.  No orders of the defined types were placed in this encounter.    Discussed warning signs or symptoms. Please see discharge instructions. Patient expresses understanding.   ***

## 2020-10-01 ENCOUNTER — Encounter: Payer: BC Managed Care – PPO | Admitting: Physical Therapy
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
## 2020-10-14 ENCOUNTER — Ambulatory Visit: Payer: Self-pay

## 2020-10-14 ENCOUNTER — Ambulatory Visit (INDEPENDENT_AMBULATORY_CARE_PROVIDER_SITE_OTHER): Payer: BC Managed Care – PPO | Admitting: Family Medicine

## 2020-10-14 ENCOUNTER — Telehealth: Payer: Self-pay | Admitting: Internal Medicine

## 2020-10-14 ENCOUNTER — Encounter: Payer: Self-pay | Admitting: Family Medicine

## 2020-10-14 VITALS — BP 112/64 | HR 88 | Ht 68.0 in | Wt 249.6 lb

## 2020-10-14 DIAGNOSIS — G5603 Carpal tunnel syndrome, bilateral upper limbs: Secondary | ICD-10-CM | POA: Diagnosis not present

## 2020-10-14 DIAGNOSIS — G5601 Carpal tunnel syndrome, right upper limb: Secondary | ICD-10-CM | POA: Diagnosis not present

## 2020-10-14 DIAGNOSIS — M62838 Other muscle spasm: Secondary | ICD-10-CM

## 2020-10-14 MED ORDER — NYSTATIN-TRIAMCINOLONE 100000-0.1 UNIT/GM-% EX OINT
1.0000 | TOPICAL_OINTMENT | Freq: Two times a day (BID) | CUTANEOUS | 0 refills | Status: DC
Start: 2020-10-14 — End: 2022-11-03

## 2020-10-14 NOTE — Telephone Encounter (Signed)
The question about the trulicity strength does make sense   In mar 2022, the strength of the trulicity was changed from 1.5mg /0.9ml strength TO the 3.0mg /0.21ml strength  Please take medication as prescribed

## 2020-10-14 NOTE — Patient Instructions (Addendum)
Thank you for coming in today.   You received an injection today in your wrist. Seek immediate medical attention if the joint becomes red, extremely painful, or is oozing fluid.   I can inject the left shoulder as soon as 1 week.   Continue the wrist brace.   Continue the home exercises.   Recheck in 6 weeks.     Carpal Tunnel Syndrome Carpal tunnel syndrome is a condition that causes pain, weakness, and numbness in your hand and arm. Numbness is when you cannot feel an area in your body. The carpal tunnel is a narrow area that is on the palm side of your wrist. Repeated wrist motion or certain diseases may cause swelling in the tunnel. This swelling can pinch the main nerve in the wrist. This nerve is called the median nerve. What are the causes? This condition may be caused by: Moving your hand and wrist over and over again while doing a task. Injury to the wrist. Arthritis. A sac of fluid (cyst) or abnormal growth (tumor) in the carpal tunnel. Fluid buildup during pregnancy. Use of tools that vibrate. Sometimes the cause is not known. What increases the risk? The following factors may make you more likely to have this condition: Having a job that makes you do these things: Move your hand over and over again. Work with tools that vibrate, such as drills or sanders. Being a woman. Having diabetes, obesity, thyroid problems, or kidney failure. What are the signs or symptoms? Symptoms of this condition include: A tingling feeling in your fingers. Tingling or loss of feeling in your hand. Pain in your entire arm. This pain may get worse when you bend your wrist and elbow for a long time. Pain in your wrist that goes up your arm to your shoulder. Pain that goes down into your palm or fingers. Weakness in your hands. You may find it hard to grab and hold items. You may feel worse at night. How is this treated? This condition may be treated with: Lifestyle changes. You will be  asked to stop or change the activity that caused your problem. Doing exercises and activities that make bones, muscles, and tendons stronger (physical therapy). Learning how to use your hand again (occupational therapy). Medicines for pain and swelling. You may have injections in your wrist. A wrist splint or brace. Surgery. Follow these instructions at home: If you have a splint or brace: Wear the splint or brace as told by your doctor. Take it off only as told by your doctor. Loosen the splint if your fingers: Tingle. Become numb. Turn cold and blue. Keep the splint or brace clean. If the splint or brace is not waterproof: Do not let it get wet. Cover it with a watertight covering when you take a bath or a shower. Managing pain, stiffness, and swelling If told, put ice on the painful area: If you have a removable splint or brace, remove it as told by your doctor. Put ice in a plastic bag. Place a towel between your skin and the bag. Leave the ice on for 20 minutes, 2-3 times per day. Do not fall asleep with the cold pack on your skin. Take off the ice if your skin turns bright red. This is very important. If you cannot feel pain, heat, or cold, you have a greater risk of damage to the area. Move your fingers often to reduce stiffness and swelling. General instructions Take over-the-counter and prescription medicines only as told by  your doctor. Rest your wrist from any activity that may cause pain. If needed, talk with your boss at work about changes that can help your wrist heal. Do exercises as told by your doctor, physical therapist, or occupational therapist. Keep all follow-up visits. Contact a doctor if: You have new symptoms. Medicine does not help your pain. Your symptoms get worse. Get help right away if: You have very bad numbness or tingling in your wrist or hand. Summary Carpal tunnel syndrome is a condition that causes pain in your hand and arm. It is often caused  by repeated wrist motions. Lifestyle changes and medicines are used to treat this problem. Surgery may help in very bad cases. Follow your doctor's instructions about wearing a splint, resting your wrist, keeping follow-up visits, and calling for help. This information is not intended to replace advice given to you by your health care provider. Make sure you discuss any questions you have with your health care provider. Document Revised: 06/06/2019 Document Reviewed: 06/06/2019 Elsevier Patient Education  2022 [employer].

## 2020-10-14 NOTE — Telephone Encounter (Signed)
Patient came into office requesting that several medications be sent to the following pharmacy:  [id number] - [place] ([place]), [place] - [address] Phone:  [phone]  Fax:  [phone]     TRULICITY 1.5 MG/0.5ML SOPN   nystatin-triamcinolone ointment (MYCOLOG)    Patient need clarification on should she be taking trulicity 0.5, or 1.5?  Please contact the patient at [phone]

## 2020-10-15 ENCOUNTER — Ambulatory Visit: Payer: BC Managed Care – PPO | Admitting: Family Medicine

## 2020-10-15 NOTE — Telephone Encounter (Signed)
Called pt, LVM to discuss.  

## 2020-11-05 ENCOUNTER — Ambulatory Visit: Payer: BC Managed Care – PPO | Admitting: Internal Medicine

## 2020-11-10 ENCOUNTER — Other Ambulatory Visit: Payer: Self-pay

## 2020-11-10 ENCOUNTER — Ambulatory Visit: Payer: BC Managed Care – PPO | Admitting: Internal Medicine

## 2020-11-10 VITALS — BP 140/86 | HR 97 | Temp 98.6°F | Ht 68.0 in | Wt 252.0 lb

## 2020-11-10 DIAGNOSIS — Z23 Encounter for immunization: Secondary | ICD-10-CM | POA: Diagnosis not present

## 2020-11-10 DIAGNOSIS — I10 Essential (primary) hypertension: Secondary | ICD-10-CM | POA: Diagnosis not present

## 2020-11-10 DIAGNOSIS — E119 Type 2 diabetes mellitus without complications: Secondary | ICD-10-CM | POA: Diagnosis not present

## 2020-11-10 DIAGNOSIS — R202 Paresthesia of skin: Secondary | ICD-10-CM

## 2020-11-10 DIAGNOSIS — E7800 Pure hypercholesterolemia, unspecified: Secondary | ICD-10-CM

## 2020-11-10 DIAGNOSIS — E1165 Type 2 diabetes mellitus with hyperglycemia: Secondary | ICD-10-CM

## 2020-11-10 DIAGNOSIS — E559 Vitamin D deficiency, unspecified: Secondary | ICD-10-CM

## 2020-11-10 DIAGNOSIS — G5603 Carpal tunnel syndrome, bilateral upper limbs: Secondary | ICD-10-CM | POA: Diagnosis not present

## 2020-11-10 DIAGNOSIS — E538 Deficiency of other specified B group vitamins: Secondary | ICD-10-CM

## 2020-11-10 LAB — POCT GLYCOSYLATED HEMOGLOBIN (HGB A1C): Hemoglobin A1C: 11.3 % — AB (ref 4.0–5.6)

## 2020-11-10 MED ORDER — METFORMIN HCL ER 500 MG PO TB24: 2000.0000 mg | ORAL_TABLET | Freq: Every day | ORAL | 3 refills | Status: DC

## 2020-11-10 MED ORDER — TRULICITY 3 MG/0.5ML ~~LOC~~ SOAJ: SUBCUTANEOUS | 3 refills | Status: DC

## 2020-11-10 MED ORDER — LISINOPRIL-HYDROCHLOROTHIAZIDE 20-12.5 MG PO TABS: 2.0000 | ORAL_TABLET | Freq: Every day | ORAL | 3 refills | Status: DC

## 2020-11-10 MED ORDER — GLIPIZIDE ER 10 MG PO TB24: 10.0000 mg | ORAL_TABLET | Freq: Every day | ORAL | 3 refills | Status: DC

## 2020-11-10 NOTE — Patient Instructions (Addendum)
You had the flu shot today  Please remember to call for GYN appt soon for routine well women's exam  Your A1c was high today at 11.3,  BUT  Ok to change the metformin to the ER 500 mg with 4 pills in the AM  Please continue the trulicity as you just restarted at once per week  Please also take the glipizide ER 10 mg in the AM  Please continue all other medications as before, and refills have been done if requested.  Please have the pharmacy call with any other refills you may need.  Please continue your efforts at being more active, low cholesterol diet, and weight control.  Please keep your appointments with your specialists as you may have planned  You will be contacted regarding the referral for: Neurology for the hands and the leg numbness  Please make an Appointment to return in 3 months, or sooner if needed, also with Lab Appointment for testing done 3-5 days before at the FIRST FLOOR Lab (so this is for TWO appointments - please see the scheduling desk as you leave)  Due to the ongoing Covid 19 pandemic, our lab now requires an appointment for any labs done at our office.  If you need labs done and do not have an appointment, please call our office ahead of time to schedule before presenting to the lab for your testing.

## 2020-11-10 NOTE — Progress Notes (Signed)
Patient ID: Samantha Ayers, female   DOB: 1970/05/09, 50 y.o.   MRN: [id number]        Chief Complaint: follow up HTN, HLD and hyperglycemia        HPI:  Samantha Ayers is a 50 y.o. female here with c/o bilat hand numbness and discomfort without weakness, mild to mod , gradually worsening x 2-3 months, also with over 1 mo mild bilateral leg numbness without pain or weakness, seems to start about the bilateral hip area, intermittent, has ongoing mid lower back pain occasionally but not clear to her the two are related.   Pt denies polydipsia, polyuria, or other new focal neuro s/s.  Pt denies chest pain, increased sob or doe, wheezing, orthopnea, PND, increased LE swelling, palpitations, dizziness or syncope.  Plans to see dental and eye exam on oct 24.  Plans to call for GYN appt soon. Due for flu shot.  Admits to significant non compliance with meds, at least more than half the time, including no trulicity which for some reason she restarted last wk.   Has hard time remembering to take the metformin 1000 in the pm as prescribed Wt Readings from Last 3 Encounters:  11/11/20 244 lb (110.7 kg)  11/10/20 252 lb (114.3 kg)  10/14/20 249 lb 9.6 oz (113.2 kg)   BP Readings from Last 3 Encounters:  11/11/20 128/72  11/10/20 140/86  10/14/20 112/64         Past Medical History:  Diagnosis Date   Allergy    Asthma    Diabetes mellitus    Hypertension    Obesity    History reviewed. No pertinent surgical history.  reports that she has never smoked. She has never used smokeless tobacco. She reports that she does not drink alcohol and does not use drugs. family history includes Diabetes in her father and mother. No Known Allergies Current Outpatient Medications on File Prior to Visit  Medication Sig Dispense Refill   ACCU-CHEK FASTCLIX LANCETS MISC USE TO CHECK BLOOD SUGARS ONCE DAILY  0   cyclobenzaprine (FLEXERIL) 5 MG tablet Take 1 tablet (5 mg total) by mouth 3 (three) times  daily as needed for muscle spasms. 180 tablet 1   glucose blood (ONETOUCH VERIO) test strip 2x daily 100 each 12   hydrocortisone-pramoxine (PROCTOFOAM HC) rectal foam Place 1 applicator rectally 3 (three) times daily. 10 g 0   ibuprofen (ADVIL) 800 MG tablet Take 1 tablet (800 mg total) by mouth every 8 (eight) hours as needed. 30 tablet 0   Lancets Misc. (ONE TOUCH SURESOFT) MISC Use 1 lancet per test. Test blood sugars 1-4 times per day as instructed. 1 each 1   nystatin-triamcinolone ointment (MYCOLOG) Apply 1 application topically 2 (two) times daily. 30 g 0   No current facility-administered medications on file prior to visit.        ROS:  All others reviewed and negative.  Objective        PE:  BP 140/86 (BP Location: Left Arm, Patient Position: Sitting, Cuff Size: Large)   Pulse 97   Temp 98.6 F (37 C) (Oral)   Ht 5\' 8"  (1.727 m)   Wt 252 lb (114.3 kg)   LMP 11/03/2020 (Exact Date)   SpO2 98%   BMI 38.32 kg/m                 Constitutional: Pt appears in NAD  HENT: Head: NCAT.                Right Ear: External ear normal.                 Left Ear: External ear normal.                Eyes: . Pupils are equal, round, and reactive to light. Conjunctivae and EOM are normal               Nose: without d/c or deformity               Neck: Neck supple. Gross normal ROM               Cardiovascular: Normal rate and regular rhythm.                 Pulmonary/Chest: Effort normal and breath sounds without rales or wheezing.                Abd:  Soft, NT, ND, + BS, no organomegaly               Neurological: Pt is alert. At baseline orientation, motor grossly intact               Skin: Skin is warm. No rashes, no other new lesions, LE edema - none               Psychiatric: Pt behavior is normal without agitation   Micro: none  Cardiac tracings I have personally interpreted today:  none  Pertinent Radiological findings (summarize): none   Lab Results  Component  Value Date   WBC 9.8 10/29/2018   HGB 11.4 (L) 10/29/2018   HCT 33.7 (L) 10/29/2018   PLT 363.0 10/29/2018   GLUCOSE 243 (H) 10/29/2018   CHOL 158 10/29/2018   TRIG 355.0 (H) 10/29/2018   HDL 34.20 (L) 10/29/2018   LDLDIRECT 92.0 10/29/2018   LDLCALC 92 12/05/2016   ALT 14 10/29/2018   AST 11 10/29/2018   NA 134 (L) 10/29/2018   K 4.2 10/29/2018   CL 99 10/29/2018   CREATININE 0.69 10/29/2018   BUN 16 10/29/2018   CO2 24 10/29/2018   TSH 0.74 10/29/2018   HGBA1C 11.3 (A) 11/10/2020   MICROALBUR 1.0 10/29/2018   Hemoglobin A1C 4.0 - 5.6 % 11.3 Abnormal   11.6 Abnormal   8.6 High  R, CM  10.2 Abnormal   9.4 Abnormal   8.5 Abnormal   10.5 High     Assessment/Plan:  Samantha Ayers is a 50 y.o. Black or African American [2] female with  has a past medical history of Allergy, Asthma, Diabetes mellitus, Hypertension, and Obesity.  Uncontrolled hypertension BP Readings from Last 3 Encounters:  11/11/20 128/72  11/10/20 140/86  10/14/20 112/64   Stable, pt to continue medical treatment zestoretic   Type 2 diabetes mellitus with hyperglycemia, without long-term current use of insulin (HCC) Lab Results  Component Value Date   HGBA1C 11.3 (A) 11/10/2020   Severe uncontrolled, pt to start glipizide, change metformin to the ER and take all in AM for simplicity, and continue the just restarted trulicity   HLD (hyperlipidemia) Lab Results  Component Value Date   LDLCALC 92 12/05/2016   uncontrolled, declines statin for now    Bilateral carpal tunnel syndrome Pt requests neurology referral,  to f/u any worsening symptoms or concerns   Bilateral leg paresthesia Etiology unclear, exam benign, for neurology as  well per pt reqeust  Followup: Return in about 3 months (around 02/10/2021).  Oliver Barre, MD 11/11/2020 8:05 PM [facility]

## 2020-11-11 ENCOUNTER — Encounter: Payer: Self-pay | Admitting: Internal Medicine

## 2020-11-11 ENCOUNTER — Ambulatory Visit (INDEPENDENT_AMBULATORY_CARE_PROVIDER_SITE_OTHER): Payer: BC Managed Care – PPO | Admitting: Obstetrics and Gynecology

## 2020-11-11 ENCOUNTER — Encounter: Payer: Self-pay | Admitting: Obstetrics and Gynecology

## 2020-11-11 VITALS — BP 128/72 | HR 99 | Ht 67.5 in | Wt 244.0 lb

## 2020-11-11 DIAGNOSIS — Z1211 Encounter for screening for malignant neoplasm of colon: Secondary | ICD-10-CM

## 2020-11-11 DIAGNOSIS — Z01419 Encounter for gynecological examination (general) (routine) without abnormal findings: Secondary | ICD-10-CM | POA: Diagnosis not present

## 2020-11-11 DIAGNOSIS — G5603 Carpal tunnel syndrome, bilateral upper limbs: Secondary | ICD-10-CM | POA: Insufficient documentation

## 2020-11-11 DIAGNOSIS — R202 Paresthesia of skin: Secondary | ICD-10-CM | POA: Insufficient documentation

## 2020-11-11 NOTE — Patient Instructions (Signed)

## 2020-11-11 NOTE — Assessment & Plan Note (Signed)
Etiology unclear, exam benign, for neurology as well per pt reqeust

## 2020-11-11 NOTE — Assessment & Plan Note (Signed)
Pt requests neurology referral,  to f/u any worsening symptoms or concerns

## 2020-11-11 NOTE — Assessment & Plan Note (Signed)
Lab Results  Component Value Date   HGBA1C 11.3 (A) 11/10/2020   Severe uncontrolled, pt to start glipizide, change metformin to the ER and take all in AM for simplicity, and continue the just restarted trulicity

## 2020-11-11 NOTE — Assessment & Plan Note (Signed)
Lab Results  Component Value Date   LDLCALC 92 12/05/2016   uncontrolled, declines statin for now

## 2020-11-11 NOTE — Progress Notes (Signed)
50 y.o. G3P0103 separated Samantha Ayers female here for annual exam.    Patient states had cycle twice in September. No hot flashes.   Has lower abdominal pain which comes and goes for 2 months.  No pain when she has her bleeding.  No relationship to any activity.  Tylenol helps the pain.   No sexual partner.  She had a pelvic ultrasound 01/30/20 with Dr. Penni Bombard.  Uterus was bulky but no fibroids.  Ovaries were normal.  Sometimes has anal irritation with BMs and bleeding if she eats spicy food.  Uses Proctofoam.  Denies dysuria.  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   A1C 11.3 yesterday. Her PCP is referring her to an endocrinologist.  PCP:  Samantha Barre, MD   Patient's last menstrual period was 11/03/2020 (exact date).           Sexually active: No.  The current method of family planning is Abstinence.    Exercising: No.  The patient does not participate in regular exercise at present. Does some walking Smoker:  no  Health Maintenance: Pap: 12-06-16 Neg:Neg HR HPV History of abnormal Pap:  no MMG: 07-11-20 3D/Neg/BiRads1 Colonoscopy:  no BMD:   n/a  Result  n/a TDaP: 08-10-15 Gardasil:   no HIV: 12-05-16 NR Hep C: no Screening Labs:  PCP   reports that she has never smoked. She has never used smokeless tobacco. She reports that she does not drink alcohol and does not use drugs.  Past Medical History:  Diagnosis Date   Allergy    Asthma    Diabetes mellitus    Hypertension    Obesity     History reviewed. No pertinent surgical history.  Current Outpatient Medications  Medication Sig Dispense Refill   ACCU-CHEK FASTCLIX LANCETS MISC USE TO CHECK BLOOD SUGARS ONCE DAILY  0   cyclobenzaprine (FLEXERIL) 5 MG tablet Take 1 tablet (5 mg total) by mouth 3 (three) times daily as needed for muscle spasms. 180 tablet 1   Dulaglutide (TRULICITY) 3 MG/0.5ML SOPN Take 0.5 ml subq injection once weekly 6 mL 3   glipiZIDE (GLUCOTROL XL) 10 MG 24 hr tablet Take 1 tablet (10 mg  total) by mouth daily with breakfast. 90 tablet 3   glucose blood (ONETOUCH VERIO) test strip 2x daily 100 each 12   hydrocortisone-pramoxine (PROCTOFOAM HC) rectal foam Place 1 applicator rectally 3 (three) times daily. 10 g 0   ibuprofen (ADVIL) 800 MG tablet Take 1 tablet (800 mg total) by mouth every 8 (eight) hours as needed. 30 tablet 0   Lancets Misc. (ONE TOUCH SURESOFT) MISC Use 1 lancet per test. Test blood sugars 1-4 times per day as instructed. 1 each 1   lisinopril-hydrochlorothiazide (ZESTORETIC) 20-12.5 MG tablet Take 2 tablets by mouth daily. 180 tablet 3   metFORMIN (GLUCOPHAGE-XR) 500 MG 24 hr tablet Take 4 tablets (2,000 mg total) by mouth daily with breakfast. 360 tablet 3   nystatin-triamcinolone ointment (MYCOLOG) Apply 1 application topically 2 (two) times daily. 30 g 0   No current facility-administered medications for this visit.    Family History  Problem Relation Age of Onset   Diabetes Mother    Diabetes Father    Breast cancer Neg Hx     Review of Systems  Genitourinary:  Positive for pelvic pain (occ. lower pelvic discomfort).  All other systems reviewed and are negative.  Exam:   BP 128/72   Pulse 99   Ht 5' 7.5" (1.715 m)   Wt 244  lb (110.7 kg)   LMP 11/03/2020 (Exact Date)   SpO2 100%   BMI 37.65 kg/m     General appearance: alert, cooperative and appears stated age Head: normocephalic, without obvious abnormality, atraumatic Neck: no adenopathy, supple, symmetrical, trachea midline and thyroid normal to inspection and palpation Lungs: clear to auscultation bilaterally Breasts: normal appearance, no masses or tenderness, No nipple retraction or dimpling, No nipple discharge or bleeding, No axillary adenopathy Heart: regular rate and rhythm Abdomen: soft, non-tender; no masses, no organomegaly Extremities: extremities normal, atraumatic, no cyanosis or edema Skin: skin color, texture, turgor normal. No rashes or lesions Lymph nodes: cervical,  supraclavicular, and axillary nodes normal. Neurologic: grossly normal  Pelvic: External genitalia:  no lesions              No abnormal inguinal nodes palpated.              Urethra:  normal appearing urethra with no masses, tenderness or lesions              Bartholins and Skenes: normal                 Vagina: normal appearing vagina with normal color and discharge, no lesions              Cervix: no lesions              Pap taken: no Bimanual Exam:  Uterus:  normal size, contour, position, consistency, mobility, non-tender              Adnexa: no mass, fullness, tenderness              Rectal exam: yes.  Confirms.              Anus:  normal sphincter tone, no lesions  Chaperone was present for exam:  Irving Burton, RN.  Assessment:   Well woman visit with gynecologic exam. Recent irregular period. Random abdominal pain.  Uncertain etiology.  Uncontrolled DM.  Colon cancer screening.   Plan: Mammogram screening discussed. Self breast awareness reviewed. Pap and HR HPV 2023. She will monitor her cycles and call if they continue to be irregular. Guidelines for Calcium, Vitamin D, regular exercise program including cardiovascular and weight bearing exercise. Referral for colonoscopy.  Labs through PCP.  Follow up annually and prn.    After visit summary provided.

## 2020-11-11 NOTE — Assessment & Plan Note (Signed)
BP Readings from Last 3 Encounters:  11/11/20 128/72  11/10/20 140/86  10/14/20 112/64   Stable, pt to continue medical treatment zestoretic

## 2020-11-19 ENCOUNTER — Encounter: Payer: Self-pay | Admitting: Neurology

## 2020-11-25 ENCOUNTER — Ambulatory Visit: Payer: BC Managed Care – PPO | Admitting: Family Medicine

## 2020-11-30 ENCOUNTER — Ambulatory Visit: Payer: BC Managed Care – PPO | Admitting: Family Medicine
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
## 2021-02-09 NOTE — Progress Notes (Signed)
[facility] Clinic Note - Initial Visit   Date: 02/10/21  Samantha Ayers MRN: [id number] DOB: 01/11/1971   Dear Dr. Jonny RuizJohn:  Thank you for your kind referral of Samantha Ayers for consultation of paresthesias. Although her history is well known to you, please allow us to reiterate it for the purpose of our medical record. The patient was accompanied to the clinic by self.    History of Present Illness: Samantha Ayers is a 51 y.o. right-handed female from [place] with poorly controlled diabetes mellitus and hypertension presenting for evaluation of bilateral hand and feet paresthesias.  Starting in early 2022, she has tingling involving the fingertips which is worse when the temperature is cold.  It occurs about once per month.  It does not wake her up from sleeping.  No weakness in the hands.  She denies neck pain, but complains of right shoulder pain. She had steroid injection to the right wrist which did not help and is concerned because it has depigmented her skin at the injection site.  She also complains of bilateral heel and Achilles pain which is intermittent and worse in cold temperature. She has rare spells of numbness/tingling in the toes.   She cannot describe whether there is numbness/tingling in the heel, says it hurts.  No leg weakness, imbalance, falls, or back pain.  She lives with 3 daughters and works two jobs.   Out-side paper records, electronic medical record, and images have been reviewed where available and summarized as:  Lab Results  Component Value Date   HGBA1C 11.3 (A) 11/10/2020   Lab Results  Component Value Date   VITAMINB12 882 07/19/2017   Lab Results  Component Value Date   TSH 0.74 10/29/2018   Lab Results  Component Value Date   ESRSEDRATE 30 (H) 10/27/2017    Past Medical History:  Diagnosis Date   Allergy    Asthma    Diabetes mellitus    Hypertension    Obesity     History  reviewed. No pertinent surgical history.   Medications:  Outpatient Encounter Medications as of 02/10/2021  Medication Sig   ACCU-CHEK FASTCLIX LANCETS MISC USE TO CHECK BLOOD SUGARS ONCE DAILY   cyclobenzaprine (FLEXERIL) 5 MG tablet Take 1 tablet (5 mg total) by mouth 3 (three) times daily as needed for muscle spasms.   Dulaglutide (TRULICITY) 3 MG/0.5ML SOPN Take 0.5 ml subq injection once weekly   glipiZIDE (GLUCOTROL XL) 10 MG 24 hr tablet Take 1 tablet (10 mg total) by mouth daily with breakfast.   glucose blood (ONETOUCH VERIO) test strip 2x daily   hydrocortisone-pramoxine (PROCTOFOAM HC) rectal foam Place 1 applicator rectally 3 (three) times daily.   ibuprofen (ADVIL) 800 MG tablet Take 1 tablet (800 mg total) by mouth every 8 (eight) hours as needed.   Lancets Misc. (ONE TOUCH SURESOFT) MISC Use 1 lancet per test. Test blood sugars 1-4 times per day as instructed.   lisinopril-hydrochlorothiazide (ZESTORETIC) 20-12.5 MG tablet Take 2 tablets by mouth daily.   metFORMIN (GLUCOPHAGE-XR) 500 MG 24 hr tablet Take 4 tablets (2,000 mg total) by mouth daily with breakfast.   nystatin-triamcinolone ointment (MYCOLOG) Apply 1 application topically 2 (two) times daily.   No facility-administered encounter medications on file as of 02/10/2021.    Allergies: No Known Allergies  Family History: Family History  Problem Relation Age of Onset   Diabetes Mother    Diabetes Father    Breast cancer Neg Hx  Social History: Social History   Tobacco Use   Smoking status: Never   Smokeless tobacco: Never  Vaping Use   Vaping Use: Never used  Substance Use Topics   Alcohol use: No   Drug use: No   Social History   Social History Narrative   Married. Immigrated from [place] 16 years ago.   Speaks English well and actively learning more.   She has 3 children which she care for alone right now, husband has returned to [place].   She works 2 jobs and active in her children's activities.    Denies abuse and feels safe at home.    Enjoys cooking.   Right handed    Lives in a two story home     Vital Signs:  BP 128/76    Pulse 94    Ht 5' 7.5" (1.715 m)    Wt 250 lb (113.4 kg)    SpO2 98%    BMI 38.58 kg/m    Neurological Exam: MENTAL STATUS including orientation to time, place, person, recent and remote memory, attention span and concentration, language, and fund of knowledge is normal.  Speech is not dysarthric.  CRANIAL NERVES: II:  No visual field defects.   III-IV-VI: Pupils equal round and reactive to light.  Normal conjugate, extra-ocular eye movements in all directions of gaze.  No nystagmus.  No ptosis.   V:  Normal facial sensation.    VII:  Normal facial symmetry and movements.   VIII:  Normal hearing and vestibular function.   IX-X:  Normal palatal movement.   XI:  Normal shoulder shrug and head rotation.   XII:  Normal tongue strength and range of motion, no deviation or fasciculation.  MOTOR:  No atrophy, fasciculations or abnormal movements.  No pronator drift.   Upper Extremity:  Right  Left  Deltoid  5/5   5/5   Biceps  5/5   5/5   Triceps  5/5   5/5   Infraspinatus 5/5  5/5  Medial pectoralis 5/5  5/5  Wrist extensors  5/5   5/5   Wrist flexors  5/5   5/5   Finger extensors  5/5   5/5   Finger flexors  5/5   5/5   Dorsal interossei  5/5   5/5   Abductor pollicis  5/5   5/5   Tone (Ashworth scale)  0  0   Lower Extremity:  Right  Left  Hip flexors  5/5   5/5   Hip extensors  5/5   5/5   Adductor 5/5  5/5  Abductor 5/5  5/5  Knee flexors  5/5   5/5   Knee extensors  5/5   5/5   Dorsiflexors  5/5   5/5   Plantarflexors  5/5   5/5   Toe extensors  5/5   5/5   Toe flexors  5/5   5/5   Tone (Ashworth scale)  0  0   MSRs:  Right        Left                  brachioradialis 1+  1+  biceps 1+  1+  triceps 1+  1+  patellar 1+  1+  ankle jerk 1+  1+  Hoffman no  no  plantar response down  down   SENSORY:  Normal and symmetric  perception of light touch, pinprick, vibration, and proprioception.  Romberg's sign absent.   COORDINATION/GAIT: Normal finger-to- nose-finger.  Intact rapid alternating movements bilaterally.  Able to rise from a chair without using arms.  Gait narrow based and stable. Tandem and stressed gait intact.    IMPRESSION: Bilateral hand paresthesias ?CTS vs cervical radiculopathy Bilateral feet paresthesias and pain. Heel/Achilles pain is not typical for neuropathy and may be secondary to tendonitis.   PLAN/RECOMMENDATIONS:  NCS/EMG of the arm and leg to better characterize the nature of her symptoms Fortunately, symptoms are only intermittent and therefore daily medication is not indicated  Further recommendations pending results.    Thank you for allowing me to participate in patient's care.  If I can answer any additional questions, I would be pleased to do so.    Sincerely,    Shantele Reller K. Allena Katz, DO

## 2021-02-10 ENCOUNTER — Other Ambulatory Visit: Payer: Self-pay

## 2021-02-10 ENCOUNTER — Ambulatory Visit: Payer: BC Managed Care – PPO | Admitting: Neurology

## 2021-02-10 ENCOUNTER — Encounter: Payer: Self-pay | Admitting: Neurology

## 2021-02-10 VITALS — BP 128/76 | HR 94 | Ht 67.5 in | Wt 250.0 lb

## 2021-02-10 DIAGNOSIS — R202 Paresthesia of skin: Secondary | ICD-10-CM

## 2021-02-10 NOTE — Patient Instructions (Signed)
Nerve testing of the arms and legs Do not apply any oil, lotion, or cream to your arms or legs on the day of testing  ELECTROMYOGRAM AND NERVE CONDUCTION STUDIES (EMG/NCS) INSTRUCTIONS  How to Prepare The neurologist conducting the EMG will need to know if you have certain medical conditions. Tell the neurologist and other EMG lab personnel if you: Have a pacemaker or any other electrical medical device Take blood-thinning medications Have hemophilia, a blood-clotting disorder that causes prolonged bleeding Bathing Take a shower or bath shortly before your exam in order to remove oils from your skin. Dont apply lotions or creams before the exam.  What to Expect Youll likely be asked to change into a hospital gown for the procedure and lie down on an examination table. The following explanations can help you understand what will happen during the exam.  Electrodes. The neurologist or a technician places surface electrodes at various locations on your skin depending on where youre experiencing symptoms. Or the neurologist may insert needle electrodes at different sites depending on your symptoms.  Sensations. The electrodes will at times transmit a tiny electrical current that you may feel as a twinge or spasm. The needle electrode may cause discomfort or pain that usually ends shortly after the needle is removed. If you are concerned about discomfort or pain, you may want to talk to the neurologist about taking a short break during the exam.  Instructions. During the needle EMG, the neurologist will assess whether there is any spontaneous electrical activity when the muscle is at rest - activity that isnt present in healthy muscle tissue - and the degree of activity when you slightly contract the muscle.  He or she will give you instructions on resting and contracting a muscle at appropriate times. Depending on what muscles and nerves the neurologist is examining, he or she may ask you to change  positions during the exam.  After your EMG You may experience some temporary, minor bruising where the needle electrode was inserted into your muscle. This bruising should fade within several days. If it persists, contact your primary care doctor.

## 2021-02-15 ENCOUNTER — Ambulatory Visit: Payer: BC Managed Care – PPO | Admitting: Neurology

## 2021-02-16 ENCOUNTER — Encounter: Payer: BC Managed Care – PPO | Admitting: Neurology

## 2021-03-23 ENCOUNTER — Encounter: Payer: BC Managed Care – PPO | Admitting: Neurology

## 2021-04-22 ENCOUNTER — Encounter: Payer: BC Managed Care – PPO | Admitting: Neurology

## 2021-05-27 ENCOUNTER — Encounter: Payer: BC Managed Care – PPO | Admitting: Neurology

## 2021-08-18 ENCOUNTER — Encounter: Payer: Self-pay | Admitting: Obstetrics and Gynecology

## 2021-08-18 ENCOUNTER — Other Ambulatory Visit: Payer: Self-pay

## 2021-08-18 ENCOUNTER — Ambulatory Visit: Payer: BC Managed Care – PPO | Admitting: Obstetrics and Gynecology

## 2021-08-18 ENCOUNTER — Telehealth: Payer: Self-pay

## 2021-08-18 VITALS — BP 138/70 | HR 97 | Temp 99.3°F | Ht 67.5 in | Wt 244.0 lb

## 2021-08-18 DIAGNOSIS — R1031 Right lower quadrant pain: Secondary | ICD-10-CM

## 2021-08-18 DIAGNOSIS — R102 Pelvic and perineal pain: Secondary | ICD-10-CM

## 2021-08-18 LAB — CBC WITH DIFFERENTIAL/PLATELET
Absolute Monocytes: 524 cells/uL (ref 200–950)
Basophils Absolute: 48 cells/uL (ref 0–200)
Basophils Relative: 0.7 %
Eosinophils Absolute: 110 cells/uL (ref 15–500)
Eosinophils Relative: 1.6 %
HCT: 37.1 % (ref 35.0–45.0)
Hemoglobin: 12.8 g/dL (ref 11.7–15.5)
Lymphs Abs: 3195 cells/uL (ref 850–3900)
MCH: 28.4 pg (ref 27.0–33.0)
MCHC: 34.5 g/dL (ref 32.0–36.0)
MCV: 82.4 fL (ref 80.0–100.0)
MPV: 10.5 fL (ref 7.5–12.5)
Monocytes Relative: 7.6 %
Neutro Abs: 3022 cells/uL (ref 1500–7800)
Neutrophils Relative %: 43.8 %
Platelets: 356 10*3/uL (ref 140–400)
RBC: 4.5 10*6/uL (ref 3.80–5.10)
RDW: 15.5 % — ABNORMAL HIGH (ref 11.0–15.0)
Total Lymphocyte: 46.3 %
WBC: 6.9 10*3/uL (ref 3.8–10.8)

## 2021-08-18 LAB — URINALYSIS, COMPLETE W/RFL CULTURE
Bacteria, UA: NONE SEEN /HPF
Bilirubin Urine: NEGATIVE
Casts: NONE SEEN /LPF
Crystals: NONE SEEN /HPF
Hgb urine dipstick: NEGATIVE
Ketones, ur: NEGATIVE
Leukocyte Esterase: NEGATIVE
Nitrites, Initial: NEGATIVE
Protein, ur: NEGATIVE
RBC / HPF: NONE SEEN /HPF (ref 0–2)
Specific Gravity, Urine: 1.02 (ref 1.001–1.035)
WBC, UA: NONE SEEN /HPF (ref 0–5)
Yeast: NONE SEEN /HPF
pH: 5 (ref 5.0–8.0)

## 2021-08-18 LAB — NO CULTURE INDICATED

## 2021-08-18 MED ORDER — IBUPROFEN 800 MG PO TABS: 800.0000 mg | ORAL_TABLET | Freq: Three times a day (TID) | ORAL | 1 refills | Status: DC | PRN

## 2021-08-18 NOTE — Progress Notes (Signed)
GYNECOLOGY  VISIT   HPI: 51 y.o.   Married  [place]  female   [phone] with Patient's last menstrual period was 08/04/2021 (exact date).   here for RLQ pain x2 days. Denies any urinary symptoms. Pain is fairly constant.  Pain constant since yesterday.  Feels like a squeeze.  Pain is right sided.  No nausea of vomiting.  No diarrhea or constipation.  Last BM this am, and pain did not improve.  Passing gas normally.  No fever. No dysuria, blood in urine or hx kidney stones.   LMP 08/04/21 and lasted 4 days.   No new partner.  No unusual discharge.   Last intercourse was over 3 years ago.   GYNECOLOGIC HISTORY: Patient's last menstrual period was 08/04/2021 (exact date). Contraception:  Abstinence Menopausal hormone therapy:  none Last mammogram:  07-11-20 Neg/Birads1 Last pap smear:   12-06-16 Neg:Neg HR HPV        OB History     Gravida  3   Para  1   Term      Preterm  1   AB  0   Living  3      SAB      IAB      Ectopic  0   Multiple      Live Births                 Patient Active Problem List   Diagnosis Date Noted   Bilateral carpal tunnel syndrome 11/11/2020   Bilateral leg paresthesia 11/11/2020   URI (upper respiratory infection) 08/21/2020   Right shoulder pain 08/21/2020   Low back pain 08/21/2020   Acromioclavicular pain 10/30/2018   HLD (hyperlipidemia) 10/30/2018   Type 2 diabetes mellitus with hyperglycemia, without long-term current use of insulin (HCC) 07/10/2018   Neck pain, musculoskeletal 05/29/2018   Encounter for general adult medical examination with abnormal findings 11/23/2016   Obesity (BMI 30-39.9) 09/27/2016   Plantar fasciitis 09/22/2016   Atypical chest pain 06/06/2016   Seasonal allergic rhinitis due to pollen 06/06/2016   Body aches 12/25/2015   Vaginal itching 12/08/2015   Uncontrolled hypertension 08/10/2015   Acute pain of right knee 08/10/2015   Achilles tendinitis 08/10/2015    Past Medical History:   Diagnosis Date   Allergy    Asthma    Diabetes mellitus    Hypertension    Obesity     History reviewed. No pertinent surgical history.  Current Outpatient Medications  Medication Sig Dispense Refill   ACCU-CHEK FASTCLIX LANCETS MISC USE TO CHECK BLOOD SUGARS ONCE DAILY  0   cyclobenzaprine (FLEXERIL) 5 MG tablet Take 1 tablet (5 mg total) by mouth 3 (three) times daily as needed for muscle spasms. 180 tablet 1   Dulaglutide (TRULICITY) 3 MG/0.5ML SOPN Take 0.5 ml subq injection once weekly 6 mL 3   glipiZIDE (GLUCOTROL XL) 10 MG 24 hr tablet Take 1 tablet (10 mg total) by mouth daily with breakfast. 90 tablet 3   glucose blood (ONETOUCH VERIO) test strip 2x daily 100 each 12   hydrocortisone-pramoxine (PROCTOFOAM HC) rectal foam Place 1 applicator rectally 3 (three) times daily. 10 g 0   Lancets Misc. (ONE TOUCH SURESOFT) MISC Use 1 lancet per test. Test blood sugars 1-4 times per day as instructed. 1 each 1   lisinopril-hydrochlorothiazide (ZESTORETIC) 20-12.5 MG tablet Take 2 tablets by mouth daily. 180 tablet 3   metFORMIN (GLUCOPHAGE-XR) 500 MG 24 hr tablet Take 4 tablets (2,000 mg  total) by mouth daily with breakfast. 360 tablet 3   nystatin-triamcinolone ointment (MYCOLOG) Apply 1 application topically 2 (two) times daily. 30 g 0   No current facility-administered medications for this visit.     ALLERGIES: Patient has no known allergies.  Family History  Problem Relation Age of Onset   Diabetes Mother    Diabetes Father    Breast cancer Neg Hx     Social History   Socioeconomic History   Marital status: Married    Spouse name: Not on file   Number of children: 3   Years of education: 16   Highest education level: Not on file  Occupational History   Occupation: Housekeeping   Occupation: other  Tobacco Use   Smoking status: Never   Smokeless tobacco: Never  Vaping Use   Vaping Use: Never used  Substance and Sexual Activity   Alcohol use: No   Drug use: No    Sexual activity: Not Currently    Partners: Male    Birth control/protection: Abstinence    Comment: regular periods , intercourse age 51, less than 5 sexual partners  Other Topics Concern   Not on file  Social History Narrative   Married. Immigrated from [place] 16 years ago.   Speaks English well and actively learning more.   She has 3 children which she care for alone right now, husband has returned to [place].   She works 2 jobs and active in her children's activities.   Denies abuse and feels safe at home.    Enjoys cooking.   Right handed    Lives in a two story home    Social Determinants of Health   Financial Resource Strain: Not on file  Food Insecurity: Not on file  Transportation Needs: Not on file  Physical Activity: Not on file  Stress: Not on file  Social Connections: Not on file  Intimate Partner Violence: Not on file    Review of Systems  Genitourinary:  Positive for pelvic pain (RLQ x2 days).  All other systems reviewed and are negative.   PHYSICAL EXAMINATION:    BP 138/70   Pulse 97   Temp 99.3 F (37.4 C) (Oral)   Ht 5' 7.5" (1.715 m)   Wt 244 lb (110.7 kg)   LMP 08/04/2021 (Exact Date)   SpO2 100%   BMI 37.65 kg/m     General appearance: alert, cooperative and appears stated age Head: Normocephalic, without obvious abnormality, atraumatic Neck: no adenopathy, supple, symmetrical, trachea midline and thyroid normal to inspection and palpation Lungs: clear to auscultation bilaterally Heart: regular rate and rhythm Abdomen: active bowel sounds, soft, non-tender, no masses,  no organomegaly No abnormal inguinal nodes palpated  Pelvic: External genitalia:  no lesions              Urethra:  normal appearing urethra with no masses, tenderness or lesions              Bartholins and Skenes: normal                 Vagina: normal appearing vagina with normal color and discharge, no lesions              Cervix: no lesions.  Spot of blood on cervix. No  CMT.                  Bimanual Exam:  Uterus:  normal size, contour, position, consistency, mobility, non-tender  Adnexa: mild right adnexal tenderness with no mass or fullness.  Left adnexa with no tenderness, mass or fullness.              Rectal exam: yes.  Confirms.              Anus:  normal sphincter tone, no lesions  Chaperone was present for exam:  Marchelle Folks, CMA  ASSESSMENT  RLQ pain.  Mild temperature elevated.  No acute abdomen.   PLAN  Urinalysis:  sg 1.020, ph 5.0, 3+ glucose, neg RBC, neg WBC, 0 - 5 squams, neg bacteria.  No UC sent.  CBC with differential.  Proceed with pelvic ultrasound.   An After Visit Summary was printed and given to the patient.  25 min  total time was spent for this patient encounter, including preparation, face-to-face counseling with the patient, coordination of care, and documentation of the encounter.

## 2021-08-18 NOTE — Telephone Encounter (Signed)
Dr. Edward Jolly asked [place] to try to schedule u/s for today.  No openings in office or at [facility]. Per Dr. Edward Jolly I spoke with patient and offered her to go to ER Department today or we can schedule u/s tomorrow. She expressed that she does not want the bill associated with ER visit and will try to wait for tomorrow.    We had 4pm here in the office tomorrow and [facility] had 10:10am. I offered patient both options and she wanted the 4:00 appt here at [facility].  She was instructed to check in at 3:45pm.   Dr. Edward Jolly notified.

## 2021-08-19 ENCOUNTER — Other Ambulatory Visit: Payer: BC Managed Care – PPO

## 2021-08-19 ENCOUNTER — Ambulatory Visit (INDEPENDENT_AMBULATORY_CARE_PROVIDER_SITE_OTHER): Payer: BC Managed Care – PPO

## 2021-08-19 DIAGNOSIS — R1031 Right lower quadrant pain: Secondary | ICD-10-CM

## 2021-08-19 DIAGNOSIS — R102 Pelvic and perineal pain: Secondary | ICD-10-CM

## 2021-08-20 ENCOUNTER — Telehealth: Payer: Self-pay | Admitting: Obstetrics and Gynecology

## 2021-08-20 NOTE — Telephone Encounter (Signed)
Phone call to discuss Pelvic US results.  Pain is improving somewhat.  She has a prescription for Motrin 800 m.   Simple right ovarian cyst noted  2.7 x 1.7 cm that appears to be collapsing.    Myometrium showed heterogeneous change and endometrium showed echogenic area 1.1 x 1.0 x 0.4 cm.   No follow up needed for the right ovarian cyst.   If RLQ pain progresses, I recommend she go to the ER for further evaluation to rule out other causes for pain such as appendicitis or renal stones.

## 2021-08-24 ENCOUNTER — Telehealth: Payer: Self-pay

## 2021-08-24 NOTE — Telephone Encounter (Signed)
Patient called in triage voice mail at 4:30pm.  She stated she was diagnosed with Right Ov Cyst and she c/o the pain continues and asked if she can get pain medication prescribed.  She said she was unable to work last week.  I called her back but got her voice mail and left message per DPR access note.  I advised her that when Dr Edward Jolly spoke with her by phone about the u/s results that she recommended: "If RLQ pain progresses, I recommend she go to the ER for further evaluation to rule out other causes for pain such as appendicitis or renal stones."  I left message and encouraged her to go to the ER this evening and be assessed.

## 2021-08-24 NOTE — Telephone Encounter (Signed)
I agree with your communication of my recommendation for ER evaluation.

## 2021-09-07 ENCOUNTER — Ambulatory Visit (INDEPENDENT_AMBULATORY_CARE_PROVIDER_SITE_OTHER): Payer: BC Managed Care – PPO | Admitting: Neurology

## 2021-09-07 DIAGNOSIS — G5601 Carpal tunnel syndrome, right upper limb: Secondary | ICD-10-CM

## 2021-09-07 DIAGNOSIS — R202 Paresthesia of skin: Secondary | ICD-10-CM | POA: Diagnosis not present

## 2021-09-07 NOTE — Procedures (Signed)
[facility]  [address] [place], [address]  [place], [place] [address] Tel: [phone] Fax:  [phone] Test Date:  09/07/2021  Patient: Samantha Ayers DOB: 1970-12-05 Physician: Nita Sickle, DO  Sex: Female Height: 5\' 7"  Ref Phys: , DO  ID#: Nita Sickle   Technician:    Patient Complaints: This is a 51 year old female referred for evaluation of bilateral hand and heel pain.  NCV & EMG Findings: Extensive electrodiagnostic testing of the right upper and lower extremity shows: Right median sensory response is absent.  Right ulnar, sural, and superficial peroneal sensory responses are within normal limits. Right median motor response shows severely prolonged latency (13.8 ms) and reduced amplitude (0.5 mV).  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  Right tibial motor response is reduced (5.6 mV).  These findings are of unclear clinical significance in the absence of associated EMG abnormalities.  Right ulnar and peroneal motor responses are within normal limits. Right tibial H reflex is within normal limits. In the right upper extremity, active on chronic motor axonal loss changes are seen in the right abductor pollicis brevis muscle. In the right lower extremity, there is no evidence of active or chronic motor axonal loss changes affecting any of the tested muscles.  Impression: Right median neuropathy at or distal to the wrist, consistent with a clinical diagnosis of carpal tunnel syndrome.  Overall, these findings are severe in degree electrically. There is no evidence of a sensorimotor polyneuropathy or lumbosacral radiculopathy affecting the right lower extremity.   ___________________________ 52, DO    Nerve Conduction Studies Anti Sensory Summary Table   Stim Site NR Peak (ms) Norm Peak (ms) P-T Amp (V)  Norm P-T Amp  Right Median Anti Sensory (2nd Digit)  35C  Wrist NR  <3.4  >20  Right Sup Peroneal Anti Sensory (Ant Lat Mall)  35C  12 cm    2.1 <4.5 7.4 >5  Right Sural Anti Sensory (Lat Mall)  35C  Calf    2.7 <4.5 6.6 >5  Right Ulnar Anti Sensory (5th Digit)  35C  Wrist    2.8 <3.1 20.7 >12   Motor Summary Table   Stim Site NR Onset (ms) Norm Onset (ms) O-P Amp (mV) Norm O-P Amp Site1 Site2 Delta-0 (ms) Dist (cm) Vel (m/s) Norm Vel (m/s)  Right Median Motor (Abd Poll Brev)  35C  Wrist    13.8 <3.9 0.5 >6 Elbow Wrist 6.2 32.0 52 >50  Elbow    20.0  0.4  Ulnar-wrist crossover Elbow 15.2 0.0    Ulnar-wrist crossover    4.8  4.6         Right Peroneal Motor (Ext Dig Brev)  35C  Ankle    3.7 <5.5 5.9 >3 B Fib Ankle 9.1 42.0 46 >40  B Fib    12.8  5.3  Poplt B Fib 1.7 10.0 59 >40  Poplt    14.5  5.3         Right Tibial Motor (Abd Hall Brev)  35C  Ankle    4.8 <6.0 5.6 >8 Knee Ankle 10.0 45.0 45 >40  Knee    14.8  3.4         Right Ulnar Motor (Abd Dig Minimi)  35C  Wrist    2.6 <3.1 9.3 >7 B Elbow Wrist 4.8 25.0 52 >50  B Elbow  7.4  8.6  A Elbow B Elbow 2.0 10.0 50 >50  A Elbow    9.4  8.6          H Reflex Studies   NR H-Lat (ms) Lat Norm (ms) L-R H-Lat (ms)  Right Tibial (Gastroc)  35C     34.01 <35    EMG   Side Muscle Ins Act Fibs Psw Fasc Number Recrt Dur Dur. Amp Amp. Poly Poly. Comment  Right AntTibialis Nml Nml Nml Nml Nml Nml Nml Nml Nml Nml Nml Nml N/A  Right Gastroc Nml Nml Nml Nml Nml Nml Nml Nml Nml Nml Nml Nml N/A  Right Flex Dig Long Nml Nml Nml Nml Nml Nml Nml Nml Nml Nml Nml Nml N/A  Right RectFemoris Nml Nml Nml Nml Nml Nml Nml Nml Nml Nml Nml Nml N/A  Right BicepsFemS Nml Nml Nml Nml Nml Nml Nml Nml Nml Nml Nml Nml N/A  Right 1stDorInt Nml Nml Nml Nml Nml Nml Nml Nml Nml Nml Nml Nml N/A  Right Abd Poll Brev Nml 1+ Nml Nml SMU Rapid All 1+ All 1+ All 1+ ATR  Right PronatorTeres Nml Nml Nml Nml Nml Nml Nml Nml Nml Nml Nml Nml N/A  Right Biceps  Nml Nml Nml Nml Nml Nml Nml Nml Nml Nml Nml Nml N/A  Right Triceps Nml Nml Nml Nml Nml Nml Nml Nml Nml Nml Nml Nml N/A  Right Deltoid Nml Nml Nml Nml Nml Nml Nml Nml Nml Nml Nml Nml N/A      Waveforms:

## 2021-09-07 NOTE — Patient Instructions (Signed)
Carpal Tunnel Syndrome  Carpal tunnel syndrome is a condition that causes pain, weakness, and numbness in your hand and arm. Numbness is when you cannot feel an area in your body. The carpal tunnel is a narrow area that is on the palm side of your wrist. Repeated wrist motion or certain diseases may cause swelling in the tunnel. This swelling can pinch the main nerve in the wrist. This nerve is called the median nerve. What are the causes? This condition may be caused by: Moving your hand and wrist over and over again while doing a task. Injury to the wrist. Arthritis. A sac of fluid (cyst) or abnormal growth (tumor) in the carpal tunnel. Fluid buildup during pregnancy. Use of tools that vibrate. Sometimes the cause is not known. What increases the risk? The following factors may make you more likely to have this condition: Having a job that makes you do these things: Move your hand over and over again. Work with tools that vibrate, such as drills or sanders. Being a woman. Having diabetes, obesity, thyroid problems, or kidney failure. What are the signs or symptoms? Symptoms of this condition include: A tingling feeling in your fingers. Tingling or loss of feeling in your hand. Pain in your entire arm. This pain may get worse when you bend your wrist and elbow for a long time. Pain in your wrist that goes up your arm to your shoulder. Pain that goes down into your palm or fingers. Weakness in your hands. You may find it hard to grab and hold items. You may feel worse at night. How is this treated? This condition may be treated with: Lifestyle changes. You will be asked to stop or change the activity that caused your problem. Doing exercises and activities that make bones, muscles, and tendons stronger (physical therapy). Learning how to use your hand again (occupational therapy). Medicines for pain and swelling. You may have injections in your wrist. A wrist splint or  brace. Surgery. Follow these instructions at home: If you have a splint or brace: Wear the splint or brace as told by your doctor. Take it off only as told by your doctor. Loosen the splint if your fingers: Tingle. Become numb. Turn cold and blue. Keep the splint or brace clean. If the splint or brace is not waterproof: Do not let it get wet. Cover it with a watertight covering when you take a bath or a shower. Managing pain, stiffness, and swelling If told, put ice on the painful area: If you have a removable splint or brace, remove it as told by your doctor. Put ice in a plastic bag. Place a towel between your skin and the bag. Leave the ice on for 20 minutes, 2-3 times per day. Do not fall asleep with the cold pack on your skin. Take off the ice if your skin turns bright red. This is very important. If you cannot feel pain, heat, or cold, you have a greater risk of damage to the area. Move your fingers often to reduce stiffness and swelling. General instructions Take over-the-counter and prescription medicines only as told by your doctor. Rest your wrist from any activity that may cause pain. If needed, talk with your boss at work about changes that can help your wrist heal. Do exercises as told by your doctor, physical therapist, or occupational therapist. Keep all follow-up visits. Contact a doctor if: You have new symptoms. Medicine does not help your pain. Your symptoms get worse. Get help right   away if: You have very bad numbness or tingling in your wrist or hand. Summary Carpal tunnel syndrome is a condition that causes pain in your hand and arm. It is often caused by repeated wrist motions. Lifestyle changes and medicines are used to treat this problem. Surgery may help in very bad cases. Follow your doctor's instructions about wearing a splint, resting your wrist, keeping follow-up visits, and calling for help. This information is not intended to replace advice given  to you by your health care provider. Make sure you discuss any questions you have with your health care provider. Document Revised: 06/06/2019 Document Reviewed: 06/06/2019 Elsevier Patient Education  2023 [employer]  

## 2021-09-27 ENCOUNTER — Ambulatory Visit: Payer: BC Managed Care – PPO | Admitting: Internal Medicine

## 2021-09-28 ENCOUNTER — Emergency Department (HOSPITAL_COMMUNITY)
Admission: EM | Admit: 2021-09-28 | Discharge: 2021-09-28 | Disposition: A | Discharge status: Home / Self Care | Payer: BC Managed Care – PPO | Attending: Emergency Medicine | Admitting: Emergency Medicine

## 2021-09-28 ENCOUNTER — Emergency Department (HOSPITAL_COMMUNITY): Payer: BC Managed Care – PPO

## 2021-09-28 ENCOUNTER — Telehealth: Payer: Self-pay | Admitting: Internal Medicine

## 2021-09-28 ENCOUNTER — Encounter (HOSPITAL_COMMUNITY): Payer: Self-pay

## 2021-09-28 ENCOUNTER — Other Ambulatory Visit: Payer: Self-pay

## 2021-09-28 ENCOUNTER — Ambulatory Visit (HOSPITAL_COMMUNITY)
Admission: EM | Admit: 2021-09-28 | Discharge: 2021-09-28 | Disposition: A | Discharge status: Home / Self Care | Payer: BC Managed Care – PPO | Attending: Family Medicine | Admitting: Family Medicine

## 2021-09-28 DIAGNOSIS — R42 Dizziness and giddiness: Secondary | ICD-10-CM | POA: Diagnosis not present

## 2021-09-28 DIAGNOSIS — E1165 Type 2 diabetes mellitus with hyperglycemia: Secondary | ICD-10-CM | POA: Diagnosis not present

## 2021-09-28 DIAGNOSIS — H9202 Otalgia, left ear: Secondary | ICD-10-CM | POA: Diagnosis not present

## 2021-09-28 DIAGNOSIS — R519 Headache, unspecified: Secondary | ICD-10-CM | POA: Insufficient documentation

## 2021-09-28 DIAGNOSIS — J45909 Unspecified asthma, uncomplicated: Secondary | ICD-10-CM | POA: Diagnosis not present

## 2021-09-28 DIAGNOSIS — Z794 Long term (current) use of insulin: Secondary | ICD-10-CM | POA: Diagnosis not present

## 2021-09-28 DIAGNOSIS — R739 Hyperglycemia, unspecified: Secondary | ICD-10-CM | POA: Diagnosis not present

## 2021-09-28 DIAGNOSIS — Z7984 Long term (current) use of oral hypoglycemic drugs: Secondary | ICD-10-CM | POA: Insufficient documentation

## 2021-09-28 DIAGNOSIS — I10 Essential (primary) hypertension: Secondary | ICD-10-CM | POA: Diagnosis not present

## 2021-09-28 DIAGNOSIS — Z20822 Contact with and (suspected) exposure to covid-19: Secondary | ICD-10-CM | POA: Diagnosis not present

## 2021-09-28 DIAGNOSIS — H538 Other visual disturbances: Secondary | ICD-10-CM | POA: Diagnosis not present

## 2021-09-28 DIAGNOSIS — Z79899 Other long term (current) drug therapy: Secondary | ICD-10-CM | POA: Insufficient documentation

## 2021-09-28 LAB — BASIC METABOLIC PANEL
Anion gap: 11 (ref 5–15)
BUN: 8 mg/dL (ref 6–20)
CO2: 24 mmol/L (ref 22–32)
Calcium: 9.2 mg/dL (ref 8.9–10.3)
Chloride: 100 mmol/L (ref 98–111)
Creatinine, Ser: 0.6 mg/dL (ref 0.44–1.00)
GFR, Estimated: >60 mL/min (ref >60)
Glucose, Bld: 270 mg/dL — ABNORMAL HIGH (ref 70–99)
Potassium: 3.8 mmol/L (ref 3.5–5.1)
Sodium: 135 mmol/L (ref 135–145)

## 2021-09-28 LAB — CBC WITH DIFFERENTIAL/PLATELET
Abs Immature Granulocytes: 0.02 10*3/uL (ref 0.00–0.07)
Basophils Absolute: 0 10*3/uL (ref 0.0–0.1)
Basophils Relative: 1 %
Eosinophils Absolute: 0 10*3/uL (ref 0.0–0.5)
Eosinophils Relative: 0 %
HCT: 37.4 % (ref 36.0–46.0)
Hemoglobin: 13.3 g/dL (ref 12.0–15.0)
Immature Granulocytes: 0 %
Lymphocytes Relative: 27 %
Lymphs Abs: 2.2 10*3/uL (ref 0.7–4.0)
MCH: 27.7 pg (ref 26.0–34.0)
MCHC: 35.6 g/dL (ref 30.0–36.0)
MCV: 77.9 fL — ABNORMAL LOW (ref 80.0–100.0)
Monocytes Absolute: 0.6 10*3/uL (ref 0.1–1.0)
Monocytes Relative: 7 %
Neutro Abs: 5.2 10*3/uL (ref 1.7–7.7)
Neutrophils Relative %: 65 %
Platelets: 296 10*3/uL (ref 150–400)
RBC: 4.8 MIL/uL (ref 3.87–5.11)
RDW: 14.6 % (ref 11.5–15.5)
WBC: 8.1 10*3/uL (ref 4.0–10.5)
nRBC: 0 % (ref 0.0–0.2)

## 2021-09-28 LAB — CBG MONITORING, ED: Glucose-Capillary: 292 mg/dL — ABNORMAL HIGH (ref 70–99)

## 2021-09-28 LAB — I-STAT BETA HCG BLOOD, ED (MC, WL, AP ONLY): I-stat hCG, quantitative: <5 m[IU]/mL (ref <5)

## 2021-09-28 LAB — RESP PANEL BY RT-PCR (FLU A&B, COVID) ARPGX2
Influenza A by PCR: NEGATIVE
Influenza B by PCR: NEGATIVE
SARS Coronavirus 2 by RT PCR: NEGATIVE

## 2021-09-28 MED ORDER — METOCLOPRAMIDE HCL 5 MG/ML IJ SOLN
10.0000 mg | Freq: Once | INTRAMUSCULAR | Status: AC
Start: 2021-09-28 — End: 2021-09-28
  Administered 2021-09-28: 10 mg via INTRAVENOUS
  Filled 2021-09-28: qty 2

## 2021-09-28 MED ORDER — ACETAMINOPHEN 500 MG PO TABS
1000.0000 mg | ORAL_TABLET | Freq: Once | ORAL | Status: AC
Start: 2021-09-28 — End: 2021-09-28
  Administered 2021-09-28: 1000 mg via ORAL
  Filled 2021-09-28: qty 2

## 2021-09-28 MED ORDER — DIPHENHYDRAMINE HCL 50 MG/ML IJ SOLN
12.5000 mg | Freq: Once | INTRAMUSCULAR | Status: AC
  Administered 2021-09-28: 12.5 mg via INTRAVENOUS
  Filled 2021-09-28: qty 1

## 2021-09-28 MED ORDER — LACTATED RINGERS IV BOLUS
500.0000 mL | Freq: Once | INTRAVENOUS | Status: AC
  Administered 2021-09-28: 500 mL via INTRAVENOUS

## 2021-09-28 NOTE — ED Provider Notes (Signed)
[facility] EMERGENCY DEPARTMENT Provider Note   CSN: [id number] Arrival date & time: 09/28/21  1458     History {Add pertinent medical, surgical, social history, OB history to HPI:1} Chief Complaint  Patient presents with  . Dizziness  . Blurred Vision  . Headache  . Ear Pain    Samantha Ayers is a 51 y.o. female.   Dizziness Associated symptoms: headaches   Headache Associated symptoms: dizziness   Patient presents for dizziness and blurred vision.  Onset was 4 hours prior to arrival.  She endorses a 7/10 headache.  She denies any recent falls or injuries.  Her medical history includes HTN, T2DM, HLD, asthma.  She is prescribed lisinopril for hypertension.  Last dose was last night.  She was seen in urgent care prior to arrival.  Blood pressure was elevated in the range of 200s over 100s.  She was sent to the ED for further evaluation.  Patient currently endorses a 7/10 severity headache.  It is located primarily on the left parietal temporal area.  She has continued blurry vision which she describes as bilateral.  She states that she typically does not get headaches.     Home Medications Prior to Admission medications   Medication Sig Start Date End Date Taking? Authorizing Provider  ACCU-CHEK FASTCLIX LANCETS MISC USE TO CHECK BLOOD SUGARS ONCE DAILY 09/29/17   [provider]  cyclobenzaprine (FLEXERIL) 5 MG tablet Take 1 tablet (5 mg total) by mouth 3 (three) times daily as needed for muscle spasms. 08/21/20   Corwin Levins, MD  Dulaglutide (TRULICITY) 3 MG/0.5ML SOPN Take 0.5 ml subq injection once weekly 11/10/20   Corwin Levins, MD  glipiZIDE (GLUCOTROL XL) 10 MG 24 hr tablet Take 1 tablet (10 mg total) by mouth daily with breakfast. 11/10/20   Corwin Levins, MD  glucose blood [facility] VERIO) test strip 2x daily 07/10/18   Shamleffer, Konrad Dolores, MD  hydrocortisone-pramoxine (PROCTOFOAM [facility]) rectal foam Place 1 applicator rectally 3  (three) times daily. 04/17/20   Olive Bass, FNP  ibuprofen (ADVIL) 800 MG tablet Take 1 tablet (800 mg total) by mouth every 8 (eight) hours as needed. 08/18/21   Patton Salles, MD  Lancets Misc. (ONE TOUCH SURESOFT) MISC Use 1 lancet per test. Test blood sugars 1-4 times per day as instructed. 12/25/15   Veryl Speak, FNP  lisinopril-hydrochlorothiazide (ZESTORETIC) 20-12.5 MG tablet Take 2 tablets by mouth daily. 11/10/20   Corwin Levins, MD  metFORMIN (GLUCOPHAGE-XR) 500 MG 24 hr tablet Take 4 tablets (2,000 mg total) by mouth daily with breakfast. 11/10/20   Corwin Levins, MD  nystatin-triamcinolone ointment [facility]) Apply 1 application topically 2 (two) times daily. 10/14/20   Corwin Levins, MD      Allergies    Patient has no known allergies.    Review of Systems   Review of Systems  Eyes:  Positive for visual disturbance.  Neurological:  Positive for dizziness and headaches.  All other systems reviewed and are negative.   Physical Exam Updated Vital Signs BP (!) 194/86 (BP Location: Left Arm)   Pulse 89   Temp 98.4 F (36.9 C) (Oral)   Resp 18   SpO2 98%  Physical Exam Vitals and nursing note reviewed.  Constitutional:      General: She is not in acute distress.    Appearance: She is well-developed. She is not ill-appearing, toxic-appearing or diaphoretic.  HENT:  Head: Normocephalic and atraumatic.     Mouth/Throat:     Mouth: Mucous membranes are moist.     Pharynx: Oropharynx is clear.  Eyes:     General: No visual field deficit.    Conjunctiva/sclera: Conjunctivae normal.  Cardiovascular:     Rate and Rhythm: Normal rate and regular rhythm.     Heart sounds: No murmur heard. Pulmonary:     Effort: Pulmonary effort is normal. No respiratory distress.     Breath sounds: Normal breath sounds. No wheezing or rales.  Chest:     Chest wall: No tenderness.  Abdominal:     General: There is no distension.     Palpations: Abdomen is soft.      Tenderness: There is no abdominal tenderness.  Musculoskeletal:        General: No swelling. Normal range of motion.     Cervical back: Normal range of motion and neck supple.  Skin:    General: Skin is warm and dry.  Neurological:     Mental Status: She is alert and oriented to person, place, and time.     Cranial Nerves: No cranial nerve deficit, dysarthria or facial asymmetry.     Sensory: No sensory deficit.     Motor: No weakness.     Coordination: Coordination normal.  Psychiatric:        Mood and Affect: Mood normal.        Speech: Speech normal.        Behavior: Behavior normal.    ED Results / Procedures / Treatments   Labs (all labs ordered are listed, but only abnormal results are displayed) Labs Reviewed  CBC WITH DIFFERENTIAL/PLATELET - Abnormal; Notable for the following components:      Result Value   MCV 77.9 (*)    All other components within normal limits  BASIC METABOLIC PANEL - Abnormal; Notable for the following components:   Glucose, Bld 270 (*)    All other components within normal limits  I-STAT BETA HCG BLOOD, ED ([facility], [facility], [facility] ONLY)    EKG EKG Interpretation  Date/Time:  Tuesday September 28 2021 15:09:24 EDT Ventricular Rate:  84 PR Interval:  152 QRS Duration: 82 QT Interval:  340 QTC Calculation: 401 R Axis:   -8 Text Interpretation: Normal sinus rhythm Minimal voltage criteria for LVH, may be normal variant ( R in aVL ) Confirmed by Godfrey Pick ([id number]) on 09/28/2021 4:26:55 PM  Radiology CT Head Wo Contrast  Result Date: 09/28/2021 CLINICAL DATA:  Headache, sudden, severe EXAM: CT HEAD WITHOUT CONTRAST TECHNIQUE: Contiguous axial images were obtained from the base of the skull through the vertex without intravenous contrast. RADIATION DOSE REDUCTION: This exam was performed according to the departmental dose-optimization program which includes automated exposure control, adjustment of the mA and/or kV according to patient size and/or use of  iterative reconstruction technique. COMPARISON:  None Available. FINDINGS: Brain: No evidence of acute infarction, hemorrhage, hydrocephalus, extra-axial collection or mass lesion/mass effect. Mild patchy white matter hypodensities, nonspecific but compatible with chronic microvascular ischemic disease. Vascular: No hyperdense vessel identified. Skull: No acute fracture. Sinuses/Orbits: Clear sinuses.  No acute orbital findings. Other: No mastoid effusions. IMPRESSION: No evidence of acute intracranial abnormality. Electronically Signed   By: Margaretha Sheffield M.D.   On: 09/28/2021 16:14    Procedures Procedures  {Document cardiac monitor, telemetry assessment procedure when appropriate:1}  Medications Ordered in ED Medications - No data to display  ED Course/ Medical Decision Making/ A&P  Medical Decision Making Amount and/or Complexity of Data Reviewed Radiology: ordered.  Risk OTC drugs. Prescription drug management.   This patient presents to the ED for concern of headache, dizziness, and blurred vision, this involves an extensive number of treatment options, and is a complaint that carries with it a high risk of complications and morbidity.  The differential diagnosis includes migraine headache, tension headache, ICH, CVA, TIA, GCA   Co morbidities that complicate the patient evaluation  HTN, T2DM, HLD, asthma   Additional history obtained:  Additional history obtained from N/A External records from outside source obtained and reviewed including EMR   Lab Tests:  Ordered, and personally interpreted labs.  The pertinent results include: Normal hemoglobin, no leukocytosis, slight microcytosis, hyperglycemia without evidence of DKA, normal electrolytes   Imaging Studies ordered:  I ordered imaging studies including CT head, MRI brain I independently visualized and interpreted imaging which showed no acute findings on CT head, MRI brain shows  chronic small vessel disease without acute findings I agree with the radiologist interpretation   Cardiac Monitoring: / EKG:  The patient was maintained on a cardiac monitor.  I personally viewed and interpreted the cardiac monitored which showed an underlying rhythm of: NS rhythm  Problem List / ED Course / Critical interventions / Medication management  Patient is a pleasant 51 year old female who presents for onset of left-sided headache, dizziness, and bilateral blurry vision earlier today.  She denies any recent trauma.  At the time of onset, she was at rest.  She went to urgent care and was directed to come to the ED.  Prior to being bedded in the ED, CT scan of head and basic lab work were obtained.  Results were unremarkable.  On arrival in the ED she continues to endorse a 7/10 severity headache.  She states that she did take ibuprofen earlier in the day.  On exam, she has no focal neurologic deficits.  Headache cocktail was ordered.  MRI brain was ordered.  On reassessment, patient had improved headache and resolution of blurry vision.  She underwent MRI which showed chronic small vessel disease without any evidence of acute findings.  On further assessment, patient reported continued resolution of symptoms. I ordered medication including ***  for ***  Reevaluation of the patient after these medicines showed that the patient {resolved/improved/worsened:23923::"improved"} I have reviewed the patients home medicines and have made adjustments as needed   Social Determinants of Health:  ***   Test / Admission - Considered:  ***   {Document critical care time when appropriate:1} {Document review of labs and clinical decision tools ie heart score, Chads2Vasc2 etc:1}  {Document your independent review of radiology images, and any outside records:1} {Document your discussion with family members, caretakers, and with consultants:1} {Document social determinants of health affecting pt's  care:1} {Document your decision making why or why not admission, treatments were needed:1} Final Clinical Impression(s) / ED Diagnoses Final diagnoses:  None    Rx / DC Orders ED Discharge Orders     None

## 2021-09-28 NOTE — Telephone Encounter (Signed)
Patient called and stated that she has been dizzy since lunch. She would like to know if there is anything she can take to alleviate symptoms.  She is scheduled to see Dr. Jonny Ruiz tomorrow at 4:00.  Call back number is [phone]

## 2021-09-28 NOTE — ED Provider Notes (Signed)
Presents with new onset blurred vision and dizziness. Reports around noon she developed these symptoms. Additionally reports headache, 7/10.  Denies chest pain, shortness of breath, abdominal pain.  No recent falls, head injury, trauma.  No history of stroke, heart attack, blood clot.  She has history of high blood pressure for which she takes lisinopril.  Reports she sometimes misses doses but she did take a dose last night.  Blood pressure is elevated 200/108 in clinic. Blood glucose 292.  With very elevated blood pressure and new onset blurry vision with dizziness, patient needs to be evaluated in the emergency department.  Patient verbalized understanding.  She understands urgent care does not have the full capabilities to evaluate her at this time.  Discharged to the emergency department with daughter as chaperone.   Silver Parkey, [place] 09/28/21 1501

## 2021-09-28 NOTE — ED Notes (Signed)
Patient is being discharged from the Urgent Care and sent to the Emergency Department via POV with family . Per [employer], PA, patient is in need of higher level of care due to hypertension, weakness, dizziness. Patient is aware and verbalizes understanding of plan of care.  Vitals:   09/28/21 1444  BP: (!) 192/108  Pulse: 91  Temp: 98.6 F (37 C)  SpO2: 97%

## 2021-09-28 NOTE — ED Triage Notes (Signed)
Pt c/o HA, dizziness/unsteady when walking, blurred vision x3hrs, ear pain x3 days ago. Seen at [facility] for dizziness, blurred vision, found to be hypertensive, sent to ED for further eval.

## 2021-09-28 NOTE — Discharge Instructions (Addendum)
D/C to ED 

## 2021-09-28 NOTE — ED Triage Notes (Signed)
Pt presents w/ headache, dizziness, and blurred vision x 2 hours. Pt was sitting in the breakroom at work when she started feeling dizzy with a headache and blurry vision.

## 2021-09-28 NOTE — ED Provider Triage Note (Signed)
Emergency Medicine Provider Triage Evaluation Note  Samantha Ayers , a 51 y.o. female  was evaluated in triage.  Pt complains of bilateral blurry vision associated with dizziness and left-sided headache.  Patient states symptoms started around noon today.  She describes her dizziness as a spinning sensation worse when closing her eyes.  Denies speech changes.  No unilateral weakness.  No history of CVA.  Patient's BP elevated prior to arrival at urgent care.  No recent head injury.  Review of Systems  Positive: Dizziness, headache Negative: CP  Physical Exam  BP (!) 194/86 (BP Location: Left Arm)   Pulse 89   Temp 98.4 F (36.9 C) (Oral)   Resp 18   SpO2 98%  Gen:   Awake, no distress   Resp:  Normal effort  MSK:   Moves extremities without difficulty  Other:  AAOx4, normal speech, no facial droop.  Equal grip strength.  5/5 strength in all 4 extremities.  Medical Decision Making  Medically screening exam initiated at 3:27 PM.  Appropriate orders placed.  Samantha Ayers was informed that the remainder of the evaluation will be completed by another provider, this initial triage assessment does not replace that evaluation, and the importance of remaining in the ED until their evaluation is complete.  No neurological deficits, CT head ordered labs   Samantha Ayers, [place] 09/28/21 1529

## 2021-09-28 NOTE — Discharge Instructions (Signed)
Keep your PCP appointment for tomorrow.  When you see your PCP, discuss blood pressure medications.  You may need to increase dosing or add on another medication for long-term control of your blood pressure.  If you have any return of concerning symptoms, please return to the emergency department.

## 2021-09-29 ENCOUNTER — Ambulatory Visit: Payer: BC Managed Care – PPO | Admitting: Internal Medicine

## 2021-09-29 ENCOUNTER — Encounter: Payer: Self-pay | Admitting: Internal Medicine

## 2021-09-29 VITALS — BP 150/84 | HR 85 | Temp 99.2°F | Ht 67.0 in | Wt 235.5 lb

## 2021-09-29 DIAGNOSIS — E1165 Type 2 diabetes mellitus with hyperglycemia: Secondary | ICD-10-CM | POA: Diagnosis not present

## 2021-09-29 DIAGNOSIS — R42 Dizziness and giddiness: Secondary | ICD-10-CM

## 2021-09-29 DIAGNOSIS — I10 Essential (primary) hypertension: Secondary | ICD-10-CM | POA: Diagnosis not present

## 2021-09-29 DIAGNOSIS — H6692 Otitis media, unspecified, left ear: Secondary | ICD-10-CM | POA: Diagnosis not present

## 2021-09-29 DIAGNOSIS — E559 Vitamin D deficiency, unspecified: Secondary | ICD-10-CM

## 2021-09-29 DIAGNOSIS — E538 Deficiency of other specified B group vitamins: Secondary | ICD-10-CM

## 2021-09-29 DIAGNOSIS — Z1159 Encounter for screening for other viral diseases: Secondary | ICD-10-CM

## 2021-09-29 LAB — POCT GLYCOSYLATED HEMOGLOBIN (HGB A1C): Hemoglobin A1C: 11.1 % — AB (ref 4.0–5.6)

## 2021-09-29 MED ORDER — TRULICITY 3 MG/0.5ML ~~LOC~~ SOAJ
SUBCUTANEOUS | 3 refills | Status: DC
Start: 2021-09-29 — End: 2021-12-22

## 2021-09-29 MED ORDER — CYCLOBENZAPRINE HCL 5 MG PO TABS: 5.0000 mg | ORAL_TABLET | Freq: Three times a day (TID) | ORAL | 1 refills | Status: DC | PRN

## 2021-09-29 MED ORDER — METFORMIN HCL ER 500 MG PO TB24
2000.0000 mg | ORAL_TABLET | Freq: Every day | ORAL | 3 refills | Status: DC
Start: 2021-09-29 — End: 2023-03-16

## 2021-09-29 MED ORDER — LISINOPRIL-HYDROCHLOROTHIAZIDE 20-12.5 MG PO TABS: 2.0000 | ORAL_TABLET | Freq: Every day | ORAL | 3 refills | Status: DC

## 2021-09-29 MED ORDER — MECLIZINE HCL 12.5 MG PO TABS: 12.5000 mg | ORAL_TABLET | Freq: Three times a day (TID) | ORAL | 1 refills | Status: DC | PRN

## 2021-09-29 MED ORDER — DAPAGLIFLOZIN PROPANEDIOL 10 MG PO TABS: 10.0000 mg | ORAL_TABLET | Freq: Every day | ORAL | 3 refills | Status: DC

## 2021-09-29 MED ORDER — IBUPROFEN 800 MG PO TABS
800.0000 mg | ORAL_TABLET | Freq: Three times a day (TID) | ORAL | 1 refills | Status: DC | PRN
Start: 2021-09-29 — End: 2021-12-21

## 2021-09-29 MED ORDER — AMOXICILLIN-POT CLAVULANATE 875-125 MG PO TABS: 1.0000 | ORAL_TABLET | Freq: Two times a day (BID) | ORAL | 0 refills | Status: DC

## 2021-09-29 NOTE — Progress Notes (Signed)
Patient ID: Samantha Ayers, female   DOB: 1970-11-03, 51 y.o.   MRN: [id number]        Chief Complaint: follow up left ear pain, vertigo, dm, htn       HPI:  Samantha Ayers is a 51 y.o. female here with c/o 3 days onset fever, left ear pain and fullness with mild intermittent vertigo.  Pt denies chest pain, increased sob or doe, wheezing, orthopnea, PND, increased LE swelling, palpitations, dizziness or syncope.   Pt denies polydipsia, polyuria, or new focal neuro s/s.    Pt denies recent wt loss, night sweats, loss of appetite, or other constitutional symptoms       Wt Readings from Last 3 Encounters:  09/29/21 235 lb 8 oz (106.8 kg)  08/18/21 244 lb (110.7 kg)  02/10/21 250 lb (113.4 kg)   BP Readings from Last 3 Encounters:  09/29/21 (!) 150/84  09/28/21 (!) 150/78  09/28/21 (!) 192/108         Past Medical History:  Diagnosis Date   Allergy    Asthma    Diabetes mellitus    Hypertension    Obesity    History reviewed. No pertinent surgical history.  reports that she has never smoked. She has never used smokeless tobacco. She reports that she does not drink alcohol and does not use drugs. family history includes Diabetes in her father and mother. No Known Allergies Current Outpatient Medications on File Prior to Visit  Medication Sig Dispense Refill   ACCU-CHEK FASTCLIX LANCETS MISC USE TO CHECK BLOOD SUGARS ONCE DAILY  0   glucose blood (ONETOUCH VERIO) test strip 2x daily 100 each 12   hydrocortisone-pramoxine (PROCTOFOAM [facility]) rectal foam Place 1 applicator rectally 3 (three) times daily. 10 g 0   Lancets Misc. (ONE TOUCH SURESOFT) MISC Use 1 lancet per test. Test blood sugars 1-4 times per day as instructed. 1 each 1   nystatin-triamcinolone ointment (MYCOLOG) Apply 1 application topically 2 (two) times daily. 30 g 0   No current facility-administered medications on file prior to visit.        ROS:  All others reviewed and negative.  Objective         PE:  BP (!) 150/84 (BP Location: Right Arm, Patient Position: Sitting, Cuff Size: Large)   Pulse 85   Temp 99.2 F (37.3 C) (Oral)   Ht 5\' 7"  (1.702 m)   Wt 235 lb 8 oz (106.8 kg)   SpO2 97%   BMI 36.88 kg/m                 Constitutional: Pt appears in NAD               HENT: Head: NCAT.                Right Ear: External ear normal.                 Left Ear: External ear normal.  Left tm with severe erythema with bulging               Eyes: . Pupils are equal, round, and reactive to light. Conjunctivae and EOM are normal               Nose: without d/c or deformity               Neck: Neck supple. Gross normal ROM  Cardiovascular: Normal rate and regular rhythm.                 Pulmonary/Chest: Effort normal and breath sounds without rales or wheezing.                Abd:  Soft, NT, ND, + BS, no organomegaly               Neurological: Pt is alert. At baseline orientation, motor grossly intact               Skin: Skin is warm. No rashes, no other new lesions, LE edema - none               Psychiatric: Pt behavior is normal without agitation   Micro: none  Cardiac tracings I have personally interpreted today:  none  Pertinent Radiological findings (summarize): none   Lab Results  Component Value Date   WBC 8.1 09/28/2021   HGB 13.3 09/28/2021   HCT 37.4 09/28/2021   PLT 296 09/28/2021   GLUCOSE 270 (H) 09/28/2021   CHOL 158 10/29/2018   TRIG 355.0 (H) 10/29/2018   HDL 34.20 (L) 10/29/2018   LDLDIRECT 92.0 10/29/2018   LDLCALC 92 12/05/2016   ALT 14 10/29/2018   AST 11 10/29/2018   NA 135 09/28/2021   K 3.8 09/28/2021   CL 100 09/28/2021   CREATININE 0.60 09/28/2021   BUN 8 09/28/2021   CO2 24 09/28/2021   TSH 0.74 10/29/2018   HGBA1C 11.1 (A) 09/29/2021   MICROALBUR 1.0 10/29/2018   Hemoglobin A1C 4.0 - 5.6 % 11.1 Abnormal   11.3 Abnormal   11.6 Abnormal   8.6 High    Assessment/Plan:  Samantha Ayers is a 51 y.o. Black or African American  [2] female with  has a past medical history of Allergy, Asthma, Diabetes mellitus, Hypertension, and Obesity.  Left otitis media Mild to mod, for antibx course - augmentin x 10 days, ibuprofen prn pain,  to f/u any worsening symptoms or concerns  Vertigo Also for meclizein 21.5 tid prn,  to f/u any worsening symptoms or concerns  Type 2 diabetes mellitus with hyperglycemia, without long-term current use of insulin (HCC) Lab Results  Component Value Date   HGBA1C 11.1 (A) 09/29/2021   Severe uncontrolled, pt to continue current medical treatment farxiga 10 qd, metformin ER 500 - 4 tabs qd but aldo inscreese trulicity 3 mg weekly    Uncontrolled hypertension BP Readings from Last 3 Encounters:  09/29/21 (!) 150/84  09/28/21 (!) 150/78  09/28/21 (!) 192/108   Uncontrolled, pt to restart medical treatment zestoretic 20-12.5 - 2 qd  Followup: Return in about 3 months (around 12/30/2021).  Samantha Barre, MD 10/01/2021 8:09 PM [facility] Internal Medicine

## 2021-09-29 NOTE — Patient Instructions (Addendum)
Please take all new medication as prescribed  - the antibiotic, and meclizine for dizziness  Please continue all other medications as before, and refills have been done if requested - the ibuprofen, and the trulicity at 3 mg, and the farxiga for sugar  Please have the pharmacy call with any other refills you may need.  Your A1c was slightly over 11 today  Please continue your efforts at being more active, low cholesterol diabetic diet, and weight control.  You are otherwise up to date with prevention measures today.  Please keep your appointments with your specialists as you may have planned  Please make an Appointment to return in 3 months, or sooner if needed, also with Lab Appointment for testing done 3-5 days before at the FIRST FLOOR Lab (so this is for TWO appointments - please see the scheduling desk as you leave)

## 2021-10-01 ENCOUNTER — Encounter: Payer: Self-pay | Admitting: Internal Medicine

## 2021-10-01 ENCOUNTER — Telehealth: Payer: Self-pay

## 2021-10-01 DIAGNOSIS — H6692 Otitis media, unspecified, left ear: Secondary | ICD-10-CM | POA: Insufficient documentation

## 2021-10-01 DIAGNOSIS — R42 Dizziness and giddiness: Secondary | ICD-10-CM | POA: Insufficient documentation

## 2021-10-01 NOTE — Assessment & Plan Note (Signed)
Also for meclizein 21.5 tid prn,  to f/u any worsening symptoms or concerns

## 2021-10-01 NOTE — Assessment & Plan Note (Signed)
Lab Results  Component Value Date   HGBA1C 11.1 (A) 09/29/2021   Severe uncontrolled, pt to continue current medical treatment farxiga 10 qd, metformin ER 500 - 4 tabs qd but aldo inscreese trulicity 3 mg weekly

## 2021-10-01 NOTE — Telephone Encounter (Signed)
Pt is requesting a work excuse for yesterday. Pt states that Dr. Jonny Ruiz didn't take her out of work yesterday but she said that she didn't feel up to going.   Her job is requesting a note from doctor to excuse her.  Please advise

## 2021-10-01 NOTE — Telephone Encounter (Signed)
Ok sure - ok for work note as per pt request

## 2021-10-01 NOTE — Assessment & Plan Note (Signed)
BP Readings from Last 3 Encounters:  09/29/21 (!) 150/84  09/28/21 (!) 150/78  09/28/21 (!) 192/108   Uncontrolled, pt to restart medical treatment zestoretic 20-12.5 - 2 qd

## 2021-10-01 NOTE — Assessment & Plan Note (Signed)
Mild to mod, for antibx course - augmentin x 10 days, ibuprofen prn pain,  to f/u any worsening symptoms or concerns

## 2021-10-05 NOTE — Telephone Encounter (Signed)
Very sorry, BP cuffs are OTC and not likely covered by insurance  We can send a glucometer, just let us know which one as there are many types and her insurance likely only covers 1 or 2 types

## 2021-10-05 NOTE — Telephone Encounter (Addendum)
Pt states she is needing a BP machine for her to check her BP at home and a Blood Glucometer sent to her pharmacy.

## 2021-10-05 NOTE — Telephone Encounter (Addendum)
Spoke with pt and was able to inform her work note was written. She states it was late that her job has already written her up.

## 2021-10-05 NOTE — Telephone Encounter (Signed)
Pt was seen on 8/23 in regard to these concerns. See OV note.
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
## 2021-12-21 ENCOUNTER — Other Ambulatory Visit: Payer: BC Managed Care – PPO

## 2021-12-21 ENCOUNTER — Encounter: Payer: Self-pay | Admitting: Internal Medicine

## 2021-12-21 ENCOUNTER — Ambulatory Visit: Payer: BC Managed Care – PPO | Admitting: Internal Medicine

## 2021-12-21 VITALS — BP 134/80 | HR 118 | Temp 98.4°F | Ht 67.0 in | Wt 233.0 lb

## 2021-12-21 DIAGNOSIS — E559 Vitamin D deficiency, unspecified: Secondary | ICD-10-CM

## 2021-12-21 DIAGNOSIS — E1165 Type 2 diabetes mellitus with hyperglycemia: Secondary | ICD-10-CM

## 2021-12-21 DIAGNOSIS — Z1231 Encounter for screening mammogram for malignant neoplasm of breast: Secondary | ICD-10-CM | POA: Diagnosis not present

## 2021-12-21 DIAGNOSIS — I10 Essential (primary) hypertension: Secondary | ICD-10-CM

## 2021-12-21 DIAGNOSIS — E7800 Pure hypercholesterolemia, unspecified: Secondary | ICD-10-CM

## 2021-12-21 DIAGNOSIS — Z0001 Encounter for general adult medical examination with abnormal findings: Secondary | ICD-10-CM | POA: Diagnosis not present

## 2021-12-21 DIAGNOSIS — E538 Deficiency of other specified B group vitamins: Secondary | ICD-10-CM

## 2021-12-21 DIAGNOSIS — Z23 Encounter for immunization: Secondary | ICD-10-CM

## 2021-12-21 DIAGNOSIS — R202 Paresthesia of skin: Secondary | ICD-10-CM | POA: Diagnosis not present

## 2021-12-21 LAB — LIPID PANEL
Cholesterol: 189 mg/dL (ref 0–200)
HDL: 39 mg/dL — ABNORMAL LOW (ref >39.00)
LDL Cholesterol: 120 mg/dL — ABNORMAL HIGH (ref 0–99)
NonHDL: 149.7
Total CHOL/HDL Ratio: 5
Triglycerides: 149 mg/dL (ref 0.0–149.0)
VLDL: 29.8 mg/dL (ref 0.0–40.0)

## 2021-12-21 LAB — TSH: TSH: 0.69 u[IU]/mL (ref 0.35–5.50)

## 2021-12-21 LAB — HEPATIC FUNCTION PANEL
ALT: 15 U/L (ref 0–35)
AST: 15 U/L (ref 0–37)
Albumin: 4.4 g/dL (ref 3.5–5.2)
Alkaline Phosphatase: 56 U/L (ref 39–117)
Bilirubin, Direct: 0.1 mg/dL (ref 0.0–0.3)
Total Bilirubin: 0.6 mg/dL (ref 0.2–1.2)
Total Protein: 8.4 g/dL — ABNORMAL HIGH (ref 6.0–8.3)

## 2021-12-21 LAB — CBC WITH DIFFERENTIAL/PLATELET
Basophils Absolute: 0 10*3/uL (ref 0.0–0.1)
Basophils Relative: 0.3 % (ref 0.0–3.0)
Eosinophils Absolute: 0.1 10*3/uL (ref 0.0–0.7)
Eosinophils Relative: 0.9 % (ref 0.0–5.0)
HCT: 40.8 % (ref 36.0–46.0)
Hemoglobin: 13.9 g/dL (ref 12.0–15.0)
Lymphocytes Relative: 49 % — ABNORMAL HIGH (ref 12.0–46.0)
Lymphs Abs: 4.2 10*3/uL — ABNORMAL HIGH (ref 0.7–4.0)
MCHC: 34 g/dL (ref 30.0–36.0)
MCV: 80.8 fl (ref 78.0–100.0)
Monocytes Absolute: 0.6 10*3/uL (ref 0.1–1.0)
Monocytes Relative: 7.5 % (ref 3.0–12.0)
Neutro Abs: 3.6 10*3/uL (ref 1.4–7.7)
Neutrophils Relative %: 42.3 % — ABNORMAL LOW (ref 43.0–77.0)
Platelets: 366 10*3/uL (ref 150.0–400.0)
RBC: 5.05 Mil/uL (ref 3.87–5.11)
RDW: 16 % — ABNORMAL HIGH (ref 11.5–15.5)
WBC: 8.5 10*3/uL (ref 4.0–10.5)

## 2021-12-21 LAB — URINALYSIS, ROUTINE W REFLEX MICROSCOPIC
Hgb urine dipstick: NEGATIVE
Ketones, ur: NEGATIVE
Leukocytes,Ua: NEGATIVE
Nitrite: NEGATIVE
RBC / HPF: NONE SEEN (ref 0–?)
Specific Gravity, Urine: 1.025 (ref 1.000–1.030)
Total Protein, Urine: NEGATIVE
Urine Glucose: NEGATIVE
Urobilinogen, UA: 0.2 (ref 0.0–1.0)
pH: 5.5 (ref 5.0–8.0)

## 2021-12-21 LAB — MICROALBUMIN / CREATININE URINE RATIO
Creatinine,U: 244.3 mg/dL
Microalb Creat Ratio: 1.1 mg/g (ref 0.0–30.0)
Microalb, Ur: 2.6 mg/dL — ABNORMAL HIGH (ref 0.0–1.9)

## 2021-12-21 LAB — VITAMIN D 25 HYDROXY (VIT D DEFICIENCY, FRACTURES): VITD: 31.94 ng/mL (ref 30.00–100.00)

## 2021-12-21 LAB — BASIC METABOLIC PANEL
BUN: 17 mg/dL (ref 6–23)
CO2: 30 mEq/L (ref 19–32)
Calcium: 9.9 mg/dL (ref 8.4–10.5)
Chloride: 94 mEq/L — ABNORMAL LOW (ref 96–112)
Creatinine, Ser: 0.82 mg/dL (ref 0.40–1.20)
GFR: 84.22 mL/min (ref >60.00)
Glucose, Bld: 193 mg/dL — ABNORMAL HIGH (ref 70–99)
Potassium: 3.9 mEq/L (ref 3.5–5.1)
Sodium: 133 mEq/L — ABNORMAL LOW (ref 135–145)

## 2021-12-21 LAB — VITAMIN B12: Vitamin B-12: 651 pg/mL (ref 211–911)

## 2021-12-21 MED ORDER — ACCU-CHEK GUIDE ME W/DEVICE KIT: PACK | 0 refills | Status: AC

## 2021-12-21 MED ORDER — IBUPROFEN 800 MG PO TABS
800.0000 mg | ORAL_TABLET | Freq: Three times a day (TID) | ORAL | 1 refills | Status: DC | PRN
Start: 2021-12-21 — End: 2022-11-03

## 2021-12-21 MED ORDER — ROSUVASTATIN CALCIUM 20 MG PO TABS: 20.0000 mg | ORAL_TABLET | Freq: Every day | ORAL | 3 refills | Status: AC

## 2021-12-21 MED ORDER — ACCU-CHEK GUIDE VI STRP: ORAL_STRIP | 12 refills | Status: AC

## 2021-12-21 MED ORDER — LANCETS MISC: 3 refills | Status: AC

## 2021-12-21 NOTE — Assessment & Plan Note (Signed)
Lab Results  Component Value Date   LDLCALC 120 (H) 12/21/2021   Uncontrolled, goal ldl < 70,, pt to start crestor 20 mg qd

## 2021-12-21 NOTE — Patient Instructions (Addendum)
You had the flu shot today  Please continue all other medications as before, and refills have been done if requested - the ibuprofen, and also the new Meter and supplies  Please have the pharmacy call with any other refills you may need.  Please continue your efforts at being more active, low cholesterol diet, and weight control.  You are otherwise up to date with prevention measures today.  Please keep your appointments with your specialists as you may have planned  You will be contacted regarding the referral for: eye doctor, and mammogram  Please go to the LAB at the blood drawing area for the tests to be done  You will be contacted by phone if any changes need to be made immediately.  Otherwise, you will receive a letter about your results with an explanation, but please check with MyChart first.  Please remember to sign up for MyChart if you have not done so, as this will be important to you in the future with finding out test results, communicating by private email, and scheduling acute appointments online when needed.  Please make an Appointment to return in 6 months, or sooner if needed

## 2021-12-21 NOTE — Assessment & Plan Note (Signed)
BP Readings from Last 3 Encounters:  12/21/21 134/80  09/29/21 (!) 150/84  09/28/21 (!) 150/78   Stable, pt to continue medical treatment zestretic 20-12.5 mg

## 2021-12-21 NOTE — Progress Notes (Signed)
Patient ID: Samantha Ayers, female   DOB: 05/25/1970, 51 y.o.   MRN: [id number]         Chief Complaint:: wellness exam and blood pressure concerns (Twitch in left eye), burning of the feet, and Medication Problem (Have not received glucose device or bp device)         HPI:  Samantha Ayers is a 51 y.o. female here for wellness exam; due for eye exam and mammogram, declines covid booster, colonoscopy, hep c screen, o/w up to date except for flu shot today                        Also has mild recurring left upper eyelid twitching at times later in the day recently.  Pt denies chest pain, increased sob or doe, wheezing, orthopnea, PND, increased LE swelling, dizziness or syncope, but has had intermittent rapid palpitatatons randomly a few times over the past several wks, but she is sure she was at rest when it happened, may have been exertional related.  Also has 3 mo worsening bilateral feet numbness, but no pain at bedtime or other.   Pt denies polydipsia, polyuria, or new focal neuro s/s.  Needs new glucometer and supplies.   Wt Readings from Last 3 Encounters:  12/21/21 233 lb (105.7 kg)  09/29/21 235 lb 8 oz (106.8 kg)  08/18/21 244 lb (110.7 kg)   BP Readings from Last 3 Encounters:  12/21/21 134/80  09/29/21 (!) 150/84  09/28/21 (!) 150/78   Immunization History  Administered Date(s) Administered   Influenza,inj,Quad PF,6+ Mos 11/23/2016, 10/25/2017, 10/30/2018, 01/20/2020, 11/10/2020, 12/21/2021   PFIZER(Purple Top)SARS-COV-2 Vaccination 04/16/2019, 05/07/2019, 01/28/2020   Pneumococcal Conjugate-13 11/23/2016   Pneumococcal Polysaccharide-23 04/26/2018   Tdap 08/10/2015   There are no preventive care reminders to display for this patient.     Past Medical History:  Diagnosis Date   Allergy    Asthma    Diabetes mellitus    Hypertension    Obesity    History reviewed. No pertinent surgical history.  reports that she has never smoked. She has never used  smokeless tobacco. She reports that she does not drink alcohol and does not use drugs. family history includes Diabetes in her father and mother. No Known Allergies Current Outpatient Medications on File Prior to Visit  Medication Sig Dispense Refill   amoxicillin-clavulanate (AUGMENTIN) 875-125 MG tablet Take 1 tablet by mouth 2 (two) times daily. 20 tablet 0   cyclobenzaprine (FLEXERIL) 5 MG tablet Take 1 tablet (5 mg total) by mouth 3 (three) times daily as needed for muscle spasms. 180 tablet 1   dapagliflozin propanediol (FARXIGA) 10 MG TABS tablet Take 1 tablet (10 mg total) by mouth daily before breakfast. 90 tablet 3   Dulaglutide (TRULICITY) 3 MG/0.5ML SOPN Take 0.5 ml subq injection once weekly 6 mL 3   hydrocortisone-pramoxine (PROCTOFOAM HC) rectal foam Place 1 applicator rectally 3 (three) times daily. 10 g 0   Lancets Misc. (ONE TOUCH SURESOFT) MISC Use 1 lancet per test. Test blood sugars 1-4 times per day as instructed. 1 each 1   lisinopril-hydrochlorothiazide (ZESTORETIC) 20-12.5 MG tablet Take 2 tablets by mouth daily. 180 tablet 3   meclizine (ANTIVERT) 12.5 MG tablet Take 1 tablet (12.5 mg total) by mouth 3 (three) times daily as needed for dizziness. 40 tablet 1   metFORMIN (GLUCOPHAGE-XR) 500 MG 24 hr tablet Take 4 tablets (2,000 mg total) by mouth daily with breakfast. 360 tablet  3   nystatin-triamcinolone ointment (MYCOLOG) Apply 1 application topically 2 (two) times daily. 30 g 0   No current facility-administered medications on file prior to visit.        ROS:  All others reviewed and negative.  Objective        PE:  BP 134/80 (BP Location: Left Arm, Patient Position: Sitting, Cuff Size: Large)   Pulse (!) 118   Temp 98.4 F (36.9 C) (Oral)   Ht 5\' 7"  (1.702 m)   Wt 233 lb (105.7 kg)   SpO2 98%   BMI 36.49 kg/m                 Constitutional: Pt appears in NAD               HENT: Head: NCAT.                Right Ear: External ear normal.                  Left Ear: External ear normal.                Eyes: . Pupils are equal, round, and reactive to light. Conjunctivae and EOM are normal               Nose: without d/c or deformity               Neck: Neck supple. Gross normal ROM               Cardiovascular: Normal rate and regular rhythm.                 Pulmonary/Chest: Effort normal and breath sounds without rales or wheezing.                Abd:  Soft, NT, ND, + BS, no organomegaly               Neurological: Pt is alert. At baseline orientation, motor grossly intact               Skin: Skin is warm. No rashes, no other new lesions, LE edema - none               Psychiatric: Pt behavior is normal without agitation   Micro: none  Cardiac tracings I have personally interpreted today:  none  Pertinent Radiological findings (summarize): none   Lab Results  Component Value Date   WBC 8.5 12/21/2021   HGB 13.9 12/21/2021   HCT 40.8 12/21/2021   PLT 366.0 12/21/2021   GLUCOSE 193 (H) 12/21/2021   CHOL 189 12/21/2021   TRIG 149.0 12/21/2021   HDL 39.00 (L) 12/21/2021   LDLDIRECT 92.0 10/29/2018   LDLCALC 120 (H) 12/21/2021   ALT 15 12/21/2021   AST 15 12/21/2021   NA 133 (L) 12/21/2021   K 3.9 12/21/2021   CL 94 (L) 12/21/2021   CREATININE 0.82 12/21/2021   BUN 17 12/21/2021   CO2 30 12/21/2021   TSH 0.69 12/21/2021   HGBA1C 11.1 (A) 09/29/2021   MICROALBUR 2.6 (H) 12/21/2021   Assessment/Plan:  Samantha Ayers 12/23/2021 is a 51 y.o. Black or African American [2] female with  has a past medical history of Allergy, Asthma, Diabetes mellitus, Hypertension, and Obesity.  Encounter for general adult medical examination with abnormal findings Age and sex appropriate education and counseling updated with regular exercise and diet Referrals for preventative services - declines colonoscopy, hep c screen, but for  mammogram, and eye MD referral Immunizations addressed - declines covid booster, but for flu shot otday Smoking counseling   - none needed Evidence for depression or other mood disorder - none significant Most recent labs reviewed. I have personally reviewed and have noted: 1) the patient's medical and social history 2) The patient's current medications and supplements 3) The patient's height, weight, and BMI have been recorded in the chart   Vitamin D deficiency Last vitamin D Lab Results  Component Value Date   VD25OH 31.94 12/21/2021   Low, to start oral replacement   Bilateral leg paresthesia Feet numb only, no pain, continue to follow for now  HLD (hyperlipidemia) Lab Results  Component Value Date   LDLCALC 120 (H) 12/21/2021   Uncontrolled, goal ldl < 70,, pt to start crestor 20 mg qd    Uncontrolled hypertension BP Readings from Last 3 Encounters:  12/21/21 134/80  09/29/21 (!) 150/84  09/28/21 (!) 150/78   Stable, pt to continue medical treatment zestretic 20-12.5 mg    Type 2 diabetes mellitus with hyperglycemia, without long-term current use of insulin (HCC) Lab Results  Component Value Date   HGBA1C 11.1 (A) 09/29/2021   Severe uncontrolled, admits to med non compliance, for f/u A1c  today, goal < 7, , pt to continue current medical treatment farxiga 10 mg qd, trulicity 3 mg weekly, metformin ER 500 mg - 4 qd  Followup: Return in about 6 months (around 06/21/2022).  Oliver Barre, MD 12/21/2021 8:05 PM [facility] [place] [facility]

## 2021-12-21 NOTE — Assessment & Plan Note (Signed)
Lab Results  Component Value Date   HGBA1C 11.1 (A) 09/29/2021   Severe uncontrolled, admits to med non compliance, for f/u A1c  today, goal < 7, , pt to continue current medical treatment farxiga 10 mg qd, trulicity 3 mg weekly, metformin ER 500 mg - 4 qd

## 2021-12-21 NOTE — Assessment & Plan Note (Signed)
Last vitamin D Lab Results  Component Value Date   VD25OH 31.94 12/21/2021   Low, to start oral replacement

## 2021-12-21 NOTE — Assessment & Plan Note (Signed)
Feet numb only, no pain, continue to follow for now

## 2021-12-21 NOTE — Assessment & Plan Note (Addendum)
Age and sex appropriate education and counseling updated with regular exercise and diet Referrals for preventative services - declines colonoscopy, hep c screen, but for mammogram, and eye MD referral Immunizations addressed - declines covid booster, but for flu shot otday Smoking counseling  - none needed Evidence for depression or other mood disorder - none significant Most recent labs reviewed. I have personally reviewed and have noted: 1) the patient's medical and social history 2) The patient's current medications and supplements 3) The patient's height, weight, and BMI have been recorded in the chart

## 2021-12-22 ENCOUNTER — Other Ambulatory Visit: Payer: Self-pay | Admitting: Internal Medicine

## 2021-12-22 ENCOUNTER — Encounter: Payer: Self-pay | Admitting: Internal Medicine

## 2021-12-22 LAB — HEMOGLOBIN A1C
Hgb A1c MFr Bld: 9.7 % of total Hgb — ABNORMAL HIGH (ref <5.7)
Mean Plasma Glucose: 232 mg/dL
eAG (mmol/L): 12.8 mmol/L

## 2021-12-22 MED ORDER — TRULICITY 4.5 MG/0.5ML ~~LOC~~ SOAJ: 4.5000 mg | SUBCUTANEOUS | 3 refills | Status: DC

## 2022-02-25 ENCOUNTER — Ambulatory Visit
Admission: RE | Admit: 2022-02-25 | Discharge: 2022-02-25 | Disposition: A | Discharge status: Home / Self Care | Payer: BC Managed Care – PPO | Source: Ambulatory Visit | Attending: Internal Medicine | Admitting: Internal Medicine

## 2022-02-25 DIAGNOSIS — Z1231 Encounter for screening mammogram for malignant neoplasm of breast: Secondary | ICD-10-CM

## 2022-04-25 ENCOUNTER — Ambulatory Visit: Payer: BC Managed Care – PPO | Admitting: Internal Medicine

## 2022-05-25 ENCOUNTER — Ambulatory Visit: Payer: BC Managed Care – PPO | Admitting: Internal Medicine

## 2022-05-25 ENCOUNTER — Encounter: Payer: Self-pay | Admitting: Internal Medicine

## 2022-05-25 VITALS — BP 128/76 | HR 82 | Temp 98.2°F | Ht 67.0 in | Wt 234.0 lb

## 2022-05-25 DIAGNOSIS — M722 Plantar fascial fibromatosis: Secondary | ICD-10-CM | POA: Diagnosis not present

## 2022-05-25 DIAGNOSIS — E1165 Type 2 diabetes mellitus with hyperglycemia: Secondary | ICD-10-CM

## 2022-05-25 DIAGNOSIS — E559 Vitamin D deficiency, unspecified: Secondary | ICD-10-CM

## 2022-05-25 DIAGNOSIS — J301 Allergic rhinitis due to pollen: Secondary | ICD-10-CM

## 2022-05-25 MED ORDER — TRIAMCINOLONE ACETONIDE 55 MCG/ACT NA AERO: 2.0000 | INHALATION_SPRAY | Freq: Every day | NASAL | 12 refills | Status: DC

## 2022-05-25 MED ORDER — FEXOFENADINE HCL 180 MG PO TABS: 180.0000 mg | ORAL_TABLET | Freq: Every day | ORAL | 11 refills | Status: DC

## 2022-05-25 MED ORDER — MELOXICAM 15 MG PO TABS: 15.0000 mg | ORAL_TABLET | Freq: Every day | ORAL | 5 refills | Status: DC | PRN

## 2022-05-25 MED ORDER — PREDNISONE 10 MG PO TABS: ORAL_TABLET | ORAL | 0 refills | Status: DC

## 2022-05-25 NOTE — Patient Instructions (Addendum)
Please take all new medication as prescribed - the anti-inflammatory as needed for pain, prednisone and allegra and nasacort for allergies  Ok to restart the trulicity as you can  Please continue all other medications as before, and refills have been done if requested.  Please have the pharmacy call with any other refills you may need.  Please continue your efforts at being more active, low cholesterol diet, and weight control.  You are otherwise up to date with prevention measures today.  Please keep your appointments with your specialists as you may have planned  Please make an Appointment to return in 6 months, or sooner if needed, also with Lab Appointment for testing done 3-5 days before at the FIRST FLOOR Lab (so this is for TWO appointments - please see the scheduling desk as you leave)

## 2022-05-25 NOTE — Progress Notes (Signed)
Patient ID: Callaway Hardigree, female   DOB: 09/24/1970, 52 y.o.   MRN: [id number]        Chief Complaint: follow up with c/o allergies, plantar fasciitis, dm, low vit d       HPI:  Vashon Arch Adamou is a 52 y.o. female Does have several wks ongoing nasal allergy symptoms with clearish congestion, itch and sneezing, without fever, pain, ST, cough, swelling or wheezing.  Also has bilateral plantar pain worse to the distal foot with mild swelilng in the setting of obesity and increased standing walking recently x 2 mo, mild no falls, worse with first few steps.  Pt denies chest pain, increased sob or doe, wheezing, orthopnea, PND, increased LE swelling, palpitations, dizziness or syncope.   Pt denies polydipsia, polyuria, or new focal neuro s/s.    Pt denies fever, wt loss, night sweats, loss of appetite, or other constitutional symptoms  Has been out of trulicity for 3 mo, willing to restart.          Wt Readings from Last 3 Encounters:  05/25/22 234 lb (106.1 kg)  12/21/21 233 lb (105.7 kg)  09/29/21 235 lb 8 oz (106.8 kg)   BP Readings from Last 3 Encounters:  05/25/22 128/76  12/21/21 134/80  09/29/21 (!) 150/84         Past Medical History:  Diagnosis Date   Allergy    Asthma    Diabetes mellitus    Hypertension    Obesity    History reviewed. No pertinent surgical history.  reports that she has never smoked. She has never used smokeless tobacco. She reports that she does not drink alcohol and does not use drugs. family history includes Diabetes in her father and mother. No Known Allergies Current Outpatient Medications on File Prior to Visit  Medication Sig Dispense Refill   amoxicillin-clavulanate (AUGMENTIN) 875-125 MG tablet Take 1 tablet by mouth 2 (two) times daily. 20 tablet 0   Blood Glucose Monitoring Suppl (ACCU-CHEK GUIDE ME) w/Device KIT Use as directed twice per day E11.9 1 kit 0   cyclobenzaprine (FLEXERIL) 5 MG tablet Take 1 tablet (5 mg total) by mouth 3  (three) times daily as needed for muscle spasms. 180 tablet 1   dapagliflozin propanediol (FARXIGA) 10 MG TABS tablet Take 1 tablet (10 mg total) by mouth daily before breakfast. 90 tablet 3   Dulaglutide (TRULICITY) 4.5 MG/0.5ML SOPN Inject 4.5 mg as directed once a week. 6 mL 3   glucose blood (ACCU-CHEK GUIDE) test strip Use as instructed twice per day E11.9 200 each 12   hydrocortisone-pramoxine (PROCTOFOAM HC) rectal foam Place 1 applicator rectally 3 (three) times daily. 10 g 0   ibuprofen (ADVIL) 800 MG tablet Take 1 tablet (800 mg total) by mouth every 8 (eight) hours as needed. 30 tablet 1   Lancets Misc. (ONE TOUCH SURESOFT) MISC Use 1 lancet per test. Test blood sugars 1-4 times per day as instructed. 1 each 1   Lancets MISC Use as directed twice per day E11.9 200 each 3   lisinopril-hydrochlorothiazide (ZESTORETIC) 20-12.5 MG tablet Take 2 tablets by mouth daily. 180 tablet 3   meclizine (ANTIVERT) 12.5 MG tablet Take 1 tablet (12.5 mg total) by mouth 3 (three) times daily as needed for dizziness. 40 tablet 1   metFORMIN (GLUCOPHAGE-XR) 500 MG 24 hr tablet Take 4 tablets (2,000 mg total) by mouth daily with breakfast. 360 tablet 3   nystatin-triamcinolone ointment (MYCOLOG) Apply 1 application topically 2 (two) times  daily. 30 g 0   rosuvastatin (CRESTOR) 20 MG tablet Take 1 tablet (20 mg total) by mouth daily. 90 tablet 3   No current facility-administered medications on file prior to visit.        ROS:  All others reviewed and negative.  Objective        PE:  BP 128/76 (BP Location: Right Arm, Patient Position: Sitting, Cuff Size: Normal)   Pulse 82   Temp 98.2 F (36.8 C) (Oral)   Ht  (1.702 m)   Wt 234 lb (106.1 kg)   SpO2 99%   BMI 36.65 kg/m                 Constitutional: Pt appears in NAD               HENT: Head: NCAT.                Right Ear: External ear normal.                 Left Ear: External ear normal. Bilat tm's with mild erythema.  Max sinus areas  non tender.  Pharynx with mild erythema, no exudate               Eyes: . Pupils are equal, round, and reactive to light. Conjunctivae and EOM are normal               Nose: without d/c or deformity               Neck: Neck supple. Gross normal ROM               Cardiovascular: Normal rate and regular rhythm.                 Pulmonary/Chest: Effort normal and breath sounds without rales or wheezing.                Abd:  Soft, NT, ND, + BS, no organomegaly               Neurological: Pt is alert. At baseline orientation, motor grossly intact               Skin: Skin is warm. No rashes, no other new lesions, LE edema - none               Psychiatric: Pt behavior is normal without agitation   Micro: none  Cardiac tracings I have personally interpreted today:  none  Pertinent Radiological findings (summarize): none   Lab Results  Component Value Date   WBC 8.5 12/21/2021   HGB 13.9 12/21/2021   HCT 40.8 12/21/2021   PLT 366.0 12/21/2021   GLUCOSE 193 (H) 12/21/2021   CHOL 189 12/21/2021   TRIG 149.0 12/21/2021   HDL 39.00 (L) 12/21/2021   LDLDIRECT 92.0 10/29/2018   LDLCALC 120 (H) 12/21/2021   ALT 15 12/21/2021   AST 15 12/21/2021   NA 133 (L) 12/21/2021   K 3.9 12/21/2021   CL 94 (L) 12/21/2021   CREATININE 0.82 12/21/2021   BUN 17 12/21/2021   CO2 30 12/21/2021   TSH 0.69 12/21/2021   HGBA1C 9.7 (H) 12/21/2021   MICROALBUR 2.6 (H) 12/21/2021   Assessment/Plan:  Merly Hinkson Adamou is a 52 y.o. Black or African American [2] female with  has a past medical history of Allergy, Asthma, Diabetes mellitus, Hypertension, and Obesity.  Seasonal allergic rhinitis due to pollen Mild to mod likely seasonal flare,  for prednisone taper, start allegra and nasacort asd, to f/u any worsening symptoms or concerns  Vitamin D deficiency Last vitamin D Lab Results  Component Value Date   VD25OH 31.94 12/21/2021   Low, to start oral replacement   Type 2 diabetes mellitus with  hyperglycemia, without long-term current use of insulin (HCC) Lab Results  Component Value Date   HGBA1C 9.7 (H) 12/21/2021   Uncontrolled in part due to non compliance, encouraged better compliance,, pt to continue current medical treatment farxiga 10 qd, metfomrin ER 500 mg - 4 qd, and restart trulicity 4.5 mg daily, and f/u a1c   Plantar fasciitis Chronic recurring likely related to overuse and obesity - for mobic prn, declines sport med or podiatry f/u for now  Followup: No follow-ups on file.  Oliver Barre, MD 05/28/2022 6:26 AM  [facility] [place] [facility]

## 2022-05-28 NOTE — Assessment & Plan Note (Signed)
Last vitamin D Lab Results  Component Value Date   VD25OH 31.94 12/21/2021   Low, to start oral replacement  

## 2022-05-28 NOTE — Assessment & Plan Note (Signed)
Mild to mod likely seasonal flare, for prednisone taper, start allegra and nasacort asd, to f/u any worsening symptoms or concerns

## 2022-05-28 NOTE — Assessment & Plan Note (Signed)
Chronic recurring likely related to overuse and obesity - for mobic prn, declines sport med or podiatry f/u for now

## 2022-05-28 NOTE — Assessment & Plan Note (Signed)
Lab Results  Component Value Date   HGBA1C 9.7 (H) 12/21/2021   Uncontrolled in part due to non compliance, encouraged better compliance,, pt to continue current medical treatment farxiga 10 qd, metfomrin ER 500 mg - 4 qd, and restart trulicity 4.5 mg daily, and f/u a1c

## 2022-06-21 ENCOUNTER — Ambulatory Visit: Payer: BC Managed Care – PPO | Admitting: Internal Medicine

## 2022-10-25 ENCOUNTER — Ambulatory Visit: Payer: BC Managed Care – PPO | Admitting: Internal Medicine

## 2022-10-25 ENCOUNTER — Encounter: Payer: Self-pay | Admitting: Internal Medicine

## 2022-10-25 VITALS — BP 126/80 | HR 85 | Temp 98.6°F | Ht 67.0 in | Wt 237.0 lb

## 2022-10-25 DIAGNOSIS — G5603 Carpal tunnel syndrome, bilateral upper limbs: Secondary | ICD-10-CM

## 2022-10-25 DIAGNOSIS — J301 Allergic rhinitis due to pollen: Secondary | ICD-10-CM

## 2022-10-25 DIAGNOSIS — M722 Plantar fascial fibromatosis: Secondary | ICD-10-CM | POA: Diagnosis not present

## 2022-10-25 DIAGNOSIS — R079 Chest pain, unspecified: Secondary | ICD-10-CM | POA: Diagnosis not present

## 2022-10-25 MED ORDER — CETIRIZINE HCL 10 MG PO TABS: 10.0000 mg | ORAL_TABLET | Freq: Every day | ORAL | 11 refills | Status: DC

## 2022-10-25 MED ORDER — OLOPATADINE HCL 0.1 % OP SOLN: 1.0000 [drp] | Freq: Two times a day (BID) | OPHTHALMIC | 12 refills | Status: DC

## 2022-10-25 MED ORDER — MELOXICAM 15 MG PO TABS: 15.0000 mg | ORAL_TABLET | Freq: Every day | ORAL | 5 refills | Status: DC | PRN

## 2022-10-25 NOTE — Progress Notes (Signed)
Patient ID: Remi Maekawa, female   DOB: 1970/11/28, 52 y.o.   MRN: [id number]        Chief Complaint: follow up bilateral heel pain, bilateral hand numbness, sore anterior chest pain, allergies including eye itching       HPI:  Demaya Janoff Adamou is a 52 y.o. female here with c/o 1-2 wks worsening bilateral plantar heel pain worse after standing at work every day with physical job, better after sits.  Also with bilateral hand numbness without pain or weakness for several wks worse after a day of work.  Also has tender sore area to left mid parasternal worse after working all day and better with ibuprofen.  Does have several wks ongoing nasal and eye allergy symptoms with clearish congestion, itch and sneezing, without fever, pain, ST, cough, swelling or wheezing.       Wt Readings from Last 3 Encounters:  10/25/22 237 lb (107.5 kg)  05/25/22 234 lb (106.1 kg)  12/21/21 233 lb (105.7 kg)   BP Readings from Last 3 Encounters:  10/25/22 126/80  05/25/22 128/76  12/21/21 134/80         Past Medical History:  Diagnosis Date   Allergy    Asthma    Diabetes mellitus    Hypertension    Obesity    History reviewed. No pertinent surgical history.  reports that she has never smoked. She has never used smokeless tobacco. She reports that she does not drink alcohol and does not use drugs. family history includes Diabetes in her father and mother. No Known Allergies Current Outpatient Medications on File Prior to Visit  Medication Sig Dispense Refill   Blood Glucose Monitoring Suppl (ACCU-CHEK GUIDE ME) w/Device KIT Use as directed twice per day E11.9 1 kit 0   cyclobenzaprine (FLEXERIL) 5 MG tablet Take 1 tablet (5 mg total) by mouth 3 (three) times daily as needed for muscle spasms. 180 tablet 1   dapagliflozin propanediol (FARXIGA) 10 MG TABS tablet Take 1 tablet (10 mg total) by mouth daily before breakfast. 90 tablet 3   Dulaglutide (TRULICITY) 4.5 MG/0.5ML SOPN Inject 4.5 mg  as directed once a week. 6 mL 3   fexofenadine (ALLEGRA) 180 MG tablet Take 1 tablet (180 mg total) by mouth daily. 30 tablet 11   glucose blood (ACCU-CHEK GUIDE) test strip Use as instructed twice per day E11.9 200 each 12   hydrocortisone-pramoxine (PROCTOFOAM HC) rectal foam Place 1 applicator rectally 3 (three) times daily. 10 g 0   ibuprofen (ADVIL) 800 MG tablet Take 1 tablet (800 mg total) by mouth every 8 (eight) hours as needed. 30 tablet 1   Lancets Misc. (ONE TOUCH SURESOFT) MISC Use 1 lancet per test. Test blood sugars 1-4 times per day as instructed. 1 each 1   Lancets MISC Use as directed twice per day E11.9 200 each 3   lisinopril-hydrochlorothiazide (ZESTORETIC) 20-12.5 MG tablet Take 2 tablets by mouth daily. 180 tablet 3   metFORMIN (GLUCOPHAGE-XR) 500 MG 24 hr tablet Take 4 tablets (2,000 mg total) by mouth daily with breakfast. 360 tablet 3   nystatin-triamcinolone ointment (MYCOLOG) Apply 1 application topically 2 (two) times daily. 30 g 0   predniSONE (DELTASONE) 10 MG tablet 3 tabs by mouth per day for 3 days,2tabs per day for 3 days,1tab per day for 3 days 18 tablet 0   rosuvastatin (CRESTOR) 20 MG tablet Take 1 tablet (20 mg total) by mouth daily. 90 tablet 3   triamcinolone (NASACORT) 55  MCG/ACT AERO nasal inhaler Place 2 sprays into the nose daily. 1 each 12   amoxicillin-clavulanate (AUGMENTIN) 875-125 MG tablet Take 1 tablet by mouth 2 (two) times daily. (Patient not taking: Reported on 10/25/2022) 20 tablet 0   No current facility-administered medications on file prior to visit.        ROS:  All others reviewed and negative.  Objective        PE:  BP 126/80 (BP Location: Right Arm, Patient Position: Sitting, Cuff Size: Normal)   Pulse 85   Temp 98.6 F (37 C) (Oral)   Ht 5\' 7"  (1.702 m)   Wt 237 lb (107.5 kg)   SpO2 98%   BMI 37.12 kg/m                 Constitutional: Pt appears in NAD               HENT: Head: NCAT.                Right Ear: External ear  normal.                 Left Ear: External ear normal.                Eyes: . Pupils are equal, round, and reactive to light. Conjunctivae and EOM are normal               Nose: without d/c or deformity               Neck: Neck supple. Gross normal ROM               Cardiovascular: Normal rate and regular rhythm.                 Pulmonary/Chest: Effort normal and breath sounds without rales or wheezing.                Abd:  Soft, NT, ND, + BS, no organomegaly               Neurological: Pt is alert. At baseline orientation, motor grossly intact               Skin: Skin is warm. No rashes, no other new lesions, LE edema - none               Psychiatric: Pt behavior is normal without agitation   Micro: none  Cardiac tracings I have personally interpreted today:  none  Pertinent Radiological findings (summarize): none   Lab Results  Component Value Date   WBC 8.5 12/21/2021   HGB 13.9 12/21/2021   HCT 40.8 12/21/2021   PLT 366.0 12/21/2021   GLUCOSE 193 (H) 12/21/2021   CHOL 189 12/21/2021   TRIG 149.0 12/21/2021   HDL 39.00 (L) 12/21/2021   LDLDIRECT 92.0 10/29/2018   LDLCALC 120 (H) 12/21/2021   ALT 15 12/21/2021   AST 15 12/21/2021   NA 133 (L) 12/21/2021   K 3.9 12/21/2021   CL 94 (L) 12/21/2021   CREATININE 0.82 12/21/2021   BUN 17 12/21/2021   CO2 30 12/21/2021   TSH 0.69 12/21/2021   HGBA1C 9.7 (H) 12/21/2021   MICROALBUR 2.6 (H) 12/21/2021   Assessment/Plan:  Shavonte Henricks Adamou is a 52 y.o. Black or African American [2] female with  has a past medical history of Allergy, Asthma, Diabetes mellitus, Hypertension, and Obesity.  Seasonal allergic rhinitis due to pollen With midl worsening including eye symptoms -  for patanol bid prn, and zyrtec 10 every day prn  Chest pain C/w msk pain - for mobic 15 every day prn  Bilateral carpal tunnel syndrome Mild, for bilat wrist splints at night  Plantar fasciitis Exam c/w bilateral heel, for mobic 15 every day prn,  also for sport medicine for any worsening, soft soled shoes  Followup: Return in about 1 month (around 11/24/2022).  Oliver Barre, MD 10/25/2022 7:42 PM [facility]

## 2022-10-25 NOTE — Assessment & Plan Note (Signed)
Exam c/w bilateral heel, for mobic 15 every day prn, also for sport medicine for any worsening, soft soled shoes

## 2022-10-25 NOTE — Patient Instructions (Signed)
Ok to change the ibuprofen to meloxicam 15 mg as needed for pain  Please see Sports Medicine if the heel pain is worsening  Please consider wrist splints at night for the carpal tunnel symptoms (but not during the day)  Please continue all other medications as before, and refills have been done if requested.  Please have the pharmacy call with any other refills you may need.  Please keep your appointments with your specialists as you may have planned

## 2022-10-25 NOTE — Assessment & Plan Note (Signed)
C/w msk pain - for mobic 15 every day prn

## 2022-10-25 NOTE — Assessment & Plan Note (Signed)
With midl worsening including eye symptoms - for patanol bid prn, and zyrtec 10 every day prn

## 2022-10-25 NOTE — Assessment & Plan Note (Signed)
Mild, for bilat wrist splints at night

## 2022-11-03 ENCOUNTER — Other Ambulatory Visit: Payer: Self-pay | Admitting: Internal Medicine

## 2022-11-03 ENCOUNTER — Encounter: Payer: Self-pay | Admitting: Obstetrics and Gynecology

## 2022-11-03 ENCOUNTER — Telehealth: Payer: Self-pay | Admitting: Obstetrics and Gynecology

## 2022-11-03 ENCOUNTER — Ambulatory Visit: Payer: BC Managed Care – PPO | Admitting: Obstetrics and Gynecology

## 2022-11-03 VITALS — BP 124/68 | Temp 98.8°F | Wt 233.0 lb

## 2022-11-03 DIAGNOSIS — T8131XA Disruption of external operation (surgical) wound, not elsewhere classified, initial encounter: Secondary | ICD-10-CM | POA: Diagnosis not present

## 2022-11-03 DIAGNOSIS — R1031 Right lower quadrant pain: Secondary | ICD-10-CM

## 2022-11-03 MED ORDER — IBUPROFEN 800 MG PO TABS
800.0000 mg | ORAL_TABLET | Freq: Three times a day (TID) | ORAL | 0 refills | Status: DC | PRN
Start: 2022-11-03 — End: 2023-09-15

## 2022-11-03 NOTE — Progress Notes (Signed)
52 y.o. [id number] female with HTN, T2DM, obesity, HLD, vitamin D deficiency here for cesarean incision pain and drainage. CD was in 2010.  Patient's last menstrual period was 10/27/2022 (approximate).   Notes pain discharge from right side of pfannenstiel incision for 2 days. She has not noticed discharge today. Pain was worse on Tuesday, rated 7/10, started meloxicam on Wednesday with improvement. Pain is minimal today. She has never had pain or drainage from her incision before.  Additionally, she notes intermittent right flank/mid abdominal pain that has been happening for 2 years Pain happens mostly when during menses and mid cycle Pain is worse with transitions (sitting and standing). Not worse with walking Non smoker  OB History  Gravida Para Term Preterm AB Living  4 1   1  0 3  SAB IAB Ectopic Multiple Live Births      0        # Outcome Date GA Lbr Len/2nd Weight Sex Type Anes PTL Lv  4 Gravida 02/19/08     CS-LTranv     3 Gravida      CS-LTranv     2 Gravida      Vag-Spont     1 Preterm         FD    Past Medical History:  Diagnosis Date   Allergy    Asthma    Diabetes mellitus    Hypertension    Obesity     Past Surgical History:  Procedure Laterality Date   CESAREAN SECTION     CESAREAN SECTION      Current Outpatient Medications on File Prior to Visit  Medication Sig Dispense Refill   Blood Glucose Monitoring Suppl (ACCU-CHEK GUIDE ME) w/Device KIT Use as directed twice per day E11.9 1 kit 0   cetirizine (ZYRTEC) 10 MG tablet Take 1 tablet (10 mg total) by mouth daily. 30 tablet 11   cyclobenzaprine (FLEXERIL) 5 MG tablet Take 1 tablet (5 mg total) by mouth 3 (three) times daily as needed for muscle spasms. 180 tablet 1   dapagliflozin propanediol (FARXIGA) 10 MG TABS tablet Take 1 tablet (10 mg total) by mouth daily before breakfast. 90 tablet 3   Dulaglutide (TRULICITY) 4.5 MG/0.5ML SOPN Inject 4.5 mg as directed once a week. 6 mL 3   glucose blood  (ACCU-CHEK GUIDE) test strip Use as instructed twice per day E11.9 200 each 12   Lancets Misc. (ONE TOUCH SURESOFT) MISC Use 1 lancet per test. Test blood sugars 1-4 times per day as instructed. 1 each 1   Lancets MISC Use as directed twice per day E11.9 200 each 3   lisinopril-hydrochlorothiazide (ZESTORETIC) 20-12.5 MG tablet Take 2 tablets by mouth daily. 180 tablet 3   meloxicam (MOBIC) 15 MG tablet Take 1 tablet (15 mg total) by mouth daily as needed for pain. 30 tablet 5   metFORMIN (GLUCOPHAGE-XR) 500 MG 24 hr tablet Take 4 tablets (2,000 mg total) by mouth daily with breakfast. 360 tablet 3   rosuvastatin (CRESTOR) 20 MG tablet Take 1 tablet (20 mg total) by mouth daily. (Patient not taking: Reported on 11/03/2022) 90 tablet 3   No current facility-administered medications on file prior to visit.    Social History   Socioeconomic History   Marital status: Married    Spouse name: Not on file   Number of children: 3   Years of education: 16   Highest education level: Not on file  Occupational History   Occupation: Stage manager  Occupation: other  Tobacco Use   Smoking status: Never   Smokeless tobacco: Never  Vaping Use   Vaping status: Never Used  Substance and Sexual Activity   Alcohol use: No   Drug use: No   Sexual activity: Not Currently    Partners: Male    Birth control/protection: Abstinence    Comment: regular periods , intercourse age 5, less than 5 sexual partners  Other Topics Concern   Not on file  Social History Narrative   Married. Immigrated from [place] 16 years ago.   Speaks English well and actively learning more.   She has 3 children which she care for alone right now, husband has returned to [place].   She works 2 jobs and active in her children's activities.   Denies abuse and feels safe at home.    Enjoys cooking.   Right handed    Lives in a two story home    Social Determinants of Health   Financial Resource Strain: Not on file  Food  Insecurity: Not on file  Transportation Needs: Not on file  Physical Activity: Not on file  Stress: Not on file  Social Connections: Not on file  Intimate Partner Violence: Not on file    Family History  Problem Relation Age of Onset   Diabetes Mother    Diabetes Father    Breast cancer Neg Hx     No Known Allergies   PE Blood pressure 124/68, temperature 98.8 F (37.1 C), temperature source Oral, weight 233 lb (105.7 kg), last menstrual period 10/27/2022.  General appearance: alert, cooperative and appears stated age Head: Normocephalic, without obvious abnormality, atraumatic Lungs: normal respiratory effort Abdomen: soft, mild TTP in RLQ, mostly along Pfannenstiel incision, no drainage from incision, no induration or fluctuance, incision well healed Skin: Normal colorm, no rahs Psych: Normal mood and affect    Assessment and Plan:  Right lower quadrant pain, chronic with exacerbation Plan: ibuprofen (ADVIL) 800 MG tablet Will order repeat TVUS since >25yr since last [place] RTO in 2-4wk Consider hormonal therapy if unimproved   Incisional drainage, resolved Patient to monitor No further management at this time Also due for annual exam, to schedule with primary      Lajoya Dombek V [facility]

## 2022-11-03 NOTE — Telephone Encounter (Signed)
Please schedule pelvic ultrasound for chronic right lower quadrant pain.

## 2022-11-04 ENCOUNTER — Other Ambulatory Visit: Payer: Self-pay

## 2022-11-04 NOTE — Telephone Encounter (Signed)
 PUS order placed.

## 2022-11-11 NOTE — Telephone Encounter (Signed)
Spoke with patient, not scheduled for PUS to date. Call placed to [facility] Main Radiology Scheduling with patient on the call. PUS scheduled for PUS on 11/15/22 at 1700 at [facility].   OV with Dr. Kennith Center r/s to 11/29/22 at 4:15pm.   Routing to provider for final review. Patient is agreeable to disposition. Will close encounter.

## 2022-11-15 ENCOUNTER — Ambulatory Visit (HOSPITAL_COMMUNITY): Payer: BC Managed Care – PPO | Attending: Obstetrics and Gynecology

## 2022-11-22 ENCOUNTER — Ambulatory Visit: Payer: BC Managed Care – PPO | Admitting: Obstetrics and Gynecology

## 2022-11-24 ENCOUNTER — Ambulatory Visit: Payer: BC Managed Care – PPO | Admitting: Internal Medicine

## 2022-11-28 ENCOUNTER — Ambulatory Visit (HOSPITAL_COMMUNITY): Payer: BC Managed Care – PPO

## 2022-11-29 ENCOUNTER — Ambulatory Visit: Payer: BC Managed Care – PPO | Admitting: Obstetrics and Gynecology

## 2022-11-30 NOTE — Progress Notes (Signed)
No show

## 2022-12-20 ENCOUNTER — Ambulatory Visit: Payer: BC Managed Care – PPO | Admitting: Obstetrics and Gynecology

## 2023-01-03 ENCOUNTER — Other Ambulatory Visit: Payer: Self-pay | Admitting: Internal Medicine

## 2023-03-16 ENCOUNTER — Other Ambulatory Visit: Payer: Self-pay | Admitting: Internal Medicine

## 2023-03-16 ENCOUNTER — Other Ambulatory Visit: Payer: Self-pay

## 2023-03-16 DIAGNOSIS — I10 Essential (primary) hypertension: Secondary | ICD-10-CM

## 2023-03-20 ENCOUNTER — Telehealth: Payer: Self-pay

## 2023-03-20 ENCOUNTER — Other Ambulatory Visit (HOSPITAL_COMMUNITY): Payer: Self-pay

## 2023-03-20 NOTE — Telephone Encounter (Signed)
 Pharmacy Patient Advocate Encounter   Received notification from CoverMyMeds that prior authorization for Trulicity  4.5MG /0.5ML auto-injectors is required/requested.   Insurance verification completed.   The patient is insured through [facility] .   Per test claim: PA required; PA submitted to above mentioned insurance via CoverMyMeds Key/confirmation #/EOC [id number] Status is pending

## 2023-03-22 ENCOUNTER — Other Ambulatory Visit (HOSPITAL_COMMUNITY): Payer: Self-pay

## 2023-03-22 NOTE — Telephone Encounter (Signed)
Pharmacy Patient Advocate Encounter  Received notification from [facility] that Prior Authorization for TRULICITY 4.5MG /0.5ML AUTO-INJECTORS has been APPROVED from 03/20/2023 to 03/19/2026. Ran test claim, Copay is $30.00. This test claim was processed through [facility]- copay amounts may vary at other pharmacies due to pharmacy/plan contracts, or as the patient moves through the different stages of their insurance plan.   PA #/Case ID/Reference #: [id number] KEY# [id number]

## 2023-05-29 ENCOUNTER — Other Ambulatory Visit: Payer: Self-pay | Admitting: Internal Medicine

## 2023-05-29 ENCOUNTER — Other Ambulatory Visit: Payer: Self-pay

## 2023-06-23 ENCOUNTER — Other Ambulatory Visit: Payer: Self-pay

## 2023-06-23 ENCOUNTER — Other Ambulatory Visit: Payer: Self-pay | Admitting: Internal Medicine

## 2023-06-23 DIAGNOSIS — I10 Essential (primary) hypertension: Secondary | ICD-10-CM

## 2023-07-04 ENCOUNTER — Telehealth: Payer: Self-pay | Admitting: Internal Medicine

## 2023-07-04 NOTE — Telephone Encounter (Signed)
 Copied from CRM [id number]. Topic: General - Other >> Jul 04, 2023 12:43 PM Samantha Ayers wrote: Reason for CRM: Patient wanted to know if doctor's note excusing her from work back in July of 2022 from the 11th-25th can be emailed to her at her email address on file. Letter was scanned in on July 15th 2022, told patient she can find letter in her MyChart but she said she does not know how to work that. Says if it can't be emailed, her or her daughter will stop by today to pick up copy.

## 2023-07-05 NOTE — Telephone Encounter (Signed)
 Pt will have to come by to pick up letter as we can't email it.

## 2023-09-15 ENCOUNTER — Ambulatory Visit: Payer: Self-pay | Admitting: Internal Medicine

## 2023-09-15 ENCOUNTER — Ambulatory Visit: Payer: Self-pay

## 2023-09-15 DIAGNOSIS — R1031 Right lower quadrant pain: Secondary | ICD-10-CM

## 2023-09-15 MED ORDER — IBUPROFEN 800 MG PO TABS: 800.0000 mg | ORAL_TABLET | Freq: Three times a day (TID) | ORAL | 0 refills | Status: DC | PRN

## 2023-09-15 NOTE — Telephone Encounter (Signed)
 FYI Only or Action Required?: FYI only for provider.  Patient was last seen in primary care on 10/25/2022 by Norleen Lynwood ORN, MD.  Called Nurse Triage reporting Foot Pain.  Symptoms began several months ago.  Interventions attempted: Prescription medications: ibuprofen  800mg  and Rest, hydration, or home remedies.  Symptoms are: gradually worsening.  Triage Disposition: See PCP When Office is Open (Within 3 Days)  Patient/caregiver understands and will follow disposition?: Yes  Copied from CRM [id number]. Topic: Clinical - Red Word Triage >> Sep 15, 2023  2:25 PM Viola FALCON wrote: Red Word that prompted transfer to Nurse Triage: Both of patients heels of foot pain are in pain, requesting an appt Reason for Disposition  [1] MODERATE pain (e.g., interferes with normal activities, limping) AND [2] present > 3 days  Answer Assessment - Initial Assessment Questions 1. ONSET: When did the pain start?      A long time I have been ignoring it. Was only right and now left is hurting 2. LOCATION: Where is the pain located?      Bilateral  3. PAIN: How bad is the pain?    (Scale 1-10; or mild, moderate, severe)     Moderate 4. WORK OR EXERCISE: Has there been any recent work or exercise that involved this part of the body?      Yes, a lot of walking while at work.  5. CAUSE: What do you think is causing the foot pain?     Unsure 6. OTHER SYMPTOMS: Do you have any other symptoms? (e.g., leg pain, rash, fever, numbness)     Denies  Additonal info:  1) Received cortisone injection in right foot in the past which was helpful. 2) Requesting ibuprofen  800mg  refill. See rx encounter.  Protocols used: Foot Pain-A-AH

## 2023-09-15 NOTE — Telephone Encounter (Signed)
 Reason for Disposition . [1] Prescription refill request for NON-ESSENTIAL medicine (i.e., no harm to patient if med not taken) AND [2] triager unable to refill per department policy  Answer Assessment - Initial Assessment Questions 1. DRUG NAME: What medicine do you need to have refilled?     Ibuprofen  800mg   6. SYMPTOMS: Do you have any symptoms?     Foot pain, see other triage encounter  Protocols used: Medication Refill and Renewal Call-A-AH

## 2023-09-18 ENCOUNTER — Other Ambulatory Visit: Payer: Self-pay

## 2023-09-18 ENCOUNTER — Encounter: Payer: Self-pay | Admitting: Internal Medicine

## 2023-09-18 ENCOUNTER — Ambulatory Visit

## 2023-09-18 ENCOUNTER — Other Ambulatory Visit: Payer: Self-pay | Admitting: Internal Medicine

## 2023-09-18 ENCOUNTER — Ambulatory Visit: Payer: Self-pay | Admitting: Internal Medicine

## 2023-09-18 ENCOUNTER — Ambulatory Visit (INDEPENDENT_AMBULATORY_CARE_PROVIDER_SITE_OTHER): Payer: Self-pay | Admitting: Internal Medicine

## 2023-09-18 VITALS — BP 114/72 | HR 97 | Temp 98.9°F | Ht 67.0 in | Wt 227.6 lb

## 2023-09-18 DIAGNOSIS — M7731 Calcaneal spur, right foot: Secondary | ICD-10-CM

## 2023-09-18 DIAGNOSIS — E1165 Type 2 diabetes mellitus with hyperglycemia: Secondary | ICD-10-CM

## 2023-09-18 DIAGNOSIS — K1370 Unspecified lesions of oral mucosa: Secondary | ICD-10-CM | POA: Diagnosis not present

## 2023-09-18 DIAGNOSIS — M79671 Pain in right foot: Secondary | ICD-10-CM | POA: Diagnosis not present

## 2023-09-18 DIAGNOSIS — Z0000 Encounter for general adult medical examination without abnormal findings: Secondary | ICD-10-CM

## 2023-09-18 DIAGNOSIS — Z7985 Long-term (current) use of injectable non-insulin antidiabetic drugs: Secondary | ICD-10-CM

## 2023-09-18 DIAGNOSIS — M79672 Pain in left foot: Secondary | ICD-10-CM | POA: Diagnosis not present

## 2023-09-18 DIAGNOSIS — Z1159 Encounter for screening for other viral diseases: Secondary | ICD-10-CM | POA: Diagnosis not present

## 2023-09-18 DIAGNOSIS — E559 Vitamin D deficiency, unspecified: Secondary | ICD-10-CM

## 2023-09-18 DIAGNOSIS — I10 Essential (primary) hypertension: Secondary | ICD-10-CM

## 2023-09-18 DIAGNOSIS — M7732 Calcaneal spur, left foot: Secondary | ICD-10-CM

## 2023-09-18 DIAGNOSIS — Z0001 Encounter for general adult medical examination with abnormal findings: Secondary | ICD-10-CM

## 2023-09-18 DIAGNOSIS — Z7984 Long term (current) use of oral hypoglycemic drugs: Secondary | ICD-10-CM

## 2023-09-18 DIAGNOSIS — E7800 Pure hypercholesterolemia, unspecified: Secondary | ICD-10-CM

## 2023-09-18 MED ORDER — METFORMIN HCL ER 500 MG PO TB24: ORAL_TABLET | ORAL | 0 refills | Status: AC

## 2023-09-18 MED ORDER — CETIRIZINE HCL 10 MG PO TABS: 10.0000 mg | ORAL_TABLET | Freq: Every day | ORAL | 11 refills | Status: AC

## 2023-09-18 MED ORDER — MELOXICAM 15 MG PO TABS: 15.0000 mg | ORAL_TABLET | Freq: Every day | ORAL | 1 refills | Status: AC | PRN

## 2023-09-18 MED ORDER — LISINOPRIL-HYDROCHLOROTHIAZIDE 20-12.5 MG PO TABS: 2.0000 | ORAL_TABLET | Freq: Every day | ORAL | 0 refills | Status: AC

## 2023-09-18 MED ORDER — DAPAGLIFLOZIN PROPANEDIOL 10 MG PO TABS: 10.0000 mg | ORAL_TABLET | Freq: Every day | ORAL | 3 refills | Status: AC

## 2023-09-18 MED ORDER — TRULICITY 4.5 MG/0.5ML ~~LOC~~ SOAJ: SUBCUTANEOUS | 0 refills | Status: DC

## 2023-09-18 NOTE — Assessment & Plan Note (Signed)
 BP Readings from Last 3 Encounters:  09/18/23 114/72  11/03/22 124/68  10/25/22 126/80   Stable, pt to continue medical treatment zestoretic  20 12.5 mg qd

## 2023-09-18 NOTE — Assessment & Plan Note (Signed)
 Has bilateral plantar and post heel pain right > left - for xray and refer sport medicine

## 2023-09-18 NOTE — Assessment & Plan Note (Signed)
 Age and sex appropriate education and counseling updated with regular exercise and diet Referrals for preventative services - for hep c screen, declines optho or colonsocopy for now Immunizations addressed - declines all Smoking counseling  - none needed Evidence for depression or other mood disorder - none significant Most recent labs reviewed. I have personally reviewed and have noted: 1) the patient's medical and social history 2) The patient's current medications and supplements 3) The patient's height, weight, and BMI have been recorded in the chart

## 2023-09-18 NOTE — Assessment & Plan Note (Signed)
Last vitamin D Lab Results  Component Value Date   VD25OH 31.94 12/21/2021   Low, to start oral replacement  

## 2023-09-18 NOTE — Assessment & Plan Note (Signed)
 Lab Results  Component Value Date   HGBA1C 9.7 (H) 12/21/2021   Uncontrolled, has been lost to f/u, for continue farxiga  10 every day, trulicity  4.5 mg weekly,metformin  ER 500 mg - 4 every day and f/u lab today

## 2023-09-18 NOTE — Assessment & Plan Note (Addendum)
 Has bilateral plantar and post heel pain right > left - for xray and refer sport medicine, also for mobic  15 mg every day prn

## 2023-09-18 NOTE — Progress Notes (Signed)
 Patient ID: Samantha Ayers, female   DOB: 1970/02/09, 53 y.o.   MRN: [id number]         Chief Complaint:: wellness exam and Foot Pain (Chronic right heel pain, new left heel pain. Notes no past injury. Noticeable swelling on left foot/ankle)  , mouth lesion, dm, htn, hld, low vit d       HPI:  Samantha Ayers is a 53 y.o. female here for wellness exam; for immunizations at pharmacy, declines optho or colonscopy for now, due for hep c screen, o/w up to date                        Also has bilateral heel pain both plantar and posterior heels, but worst at the right plantar heel.  Very difficult to even stand and walk.  Unable to lose wt.  Also has an oral lesion just behind the front teeth noted at dentist and has written request for ENT referra.   Pt denies chest pain, increased sob or doe, wheezing, orthopnea, PND, increased LE swelling, palpitations, dizziness or syncope.   Pt denies polydipsia, polyuria, or new focal neuro s/s.    Pt denies fever, wt loss, night sweats, loss of appetite, or other constitutional symptoms    Wt Readings from Last 3 Encounters:  09/18/23 227 lb 9.6 oz (103.2 kg)  11/03/22 233 lb (105.7 kg)  10/25/22 237 lb (107.5 kg)   BP Readings from Last 3 Encounters:  09/18/23 114/72  11/03/22 124/68  10/25/22 126/80   Immunization History  Administered Date(s) Administered   Influenza,inj,Quad PF,6+ Mos 11/23/2016, 10/25/2017, 10/30/2018, 01/20/2020, 11/10/2020, 12/21/2021   PFIZER(Purple Top)SARS-COV-2 Vaccination 04/16/2019, 05/07/2019, 01/28/2020   Pneumococcal Conjugate-13 11/23/2016   Pneumococcal Polysaccharide-23 04/26/2018   Tdap 08/10/2015   Health Maintenance Due  Topic Date Due   Diabetic kidney evaluation - Urine ACR  Never done   Hepatitis C Screening  Never done   Hepatitis B Vaccines (1 of 3 - 19+ 3-dose series) Never done   Colonoscopy  Never done   OPHTHALMOLOGY EXAM  10/03/2019   Cervical Cancer Screening (HPV/Pap Cotest)   12/06/2021   HEMOGLOBIN A1C  06/21/2022   COVID-19 Vaccine (4 - 2024-25 season) 10/09/2022   Zoster Vaccines- Shingrix (1 of 2) Never done   Diabetic kidney evaluation - eGFR measurement  12/22/2022   Pneumococcal Vaccine: 19-49 Years (3 of 3 - PCV20 or PCV21) 04/26/2023   Pneumococcal Vaccine: 50+ Years (3 of 3 - PCV20 or PCV21) 04/26/2023   INFLUENZA VACCINE  09/08/2023      Past Medical History:  Diagnosis Date   Allergy    Asthma    Diabetes mellitus    Hypertension    Obesity    Past Surgical History:  Procedure Laterality Date   CESAREAN SECTION     CESAREAN SECTION      reports that she has never smoked. She has never used smokeless tobacco. She reports that she does not drink alcohol and does not use drugs. family history includes Diabetes in her father and mother. No Known Allergies Current Outpatient Medications on File Prior to Visit  Medication Sig Dispense Refill   Blood Glucose Monitoring Suppl (ACCU-CHEK GUIDE ME) w/Device KIT Use as directed twice per day E11.9 1 kit 0   cyclobenzaprine  (FLEXERIL ) 5 MG tablet Take 1 tablet by mouth three times daily as needed for muscle spasm 180 tablet 0   glucose blood (ACCU-CHEK GUIDE) test strip Use as instructed  twice per day E11.9 200 each 12   ibuprofen  (ADVIL ) 800 MG tablet Take 1 tablet (800 mg total) by mouth every 8 (eight) hours as needed. 30 tablet 0   Lancets Misc. (ONE TOUCH SURESOFT) MISC Use 1 lancet per test. Test blood sugars 1-4 times per day as instructed. 1 each 1   Lancets MISC Use as directed twice per day E11.9 200 each 3   meclizine  (ANTIVERT ) 12.5 MG tablet TAKE 1 TABLET BY MOUTH THREE TIMES DAILY AS NEEDED FOR DIZZINESS 40 tablet 0   rosuvastatin  (CRESTOR ) 20 MG tablet Take 1 tablet (20 mg total) by mouth daily. (Patient not taking: Reported on 09/18/2023) 90 tablet 3   No current facility-administered medications on file prior to visit.        ROS:  All others reviewed and negative.  Objective         PE:  BP 114/72   Pulse 97   Temp 98.9 F (37.2 C)   Ht 5' 7 (1.702 m)   Wt 227 lb 9.6 oz (103.2 kg)   SpO2 98%   BMI 35.65 kg/m                 Constitutional: Pt appears in NAD               HENT: Head: NCAT.                Right Ear: External ear normal.                 Left Ear: External ear normal.                Eyes: . Pupils are equal, round, and reactive to light. Conjunctivae and EOM are normal               Nose: without d/c or deformity               Neck: Neck supple. Gross normal ROM               Cardiovascular: Normal rate and regular rhythm.                 Pulmonary/Chest: Effort normal and breath sounds without rales or wheezing.                Abd:  Soft, NT, ND, + BS, no organomegaly               Neurological: Pt is alert. At baseline orientation, motor grossly intact               Skin: Skin is warm. No rashes, no other new lesions, LE edema - nonel right plantar > left marked pain and tenderness, also tender bilateral achilles insertion sites with swelling hard and soft               Psychiatric: Pt behavior is normal without agitation   Micro: none  Cardiac tracings I have personally interpreted today:  none  Pertinent Radiological findings (summarize): none   Lab Results  Component Value Date   WBC 8.5 12/21/2021   HGB 13.9 12/21/2021   HCT 40.8 12/21/2021   PLT 366.0 12/21/2021   GLUCOSE 193 (H) 12/21/2021   CHOL 189 12/21/2021   TRIG 149.0 12/21/2021   HDL 39.00 (L) 12/21/2021   LDLDIRECT 92.0 10/29/2018   LDLCALC 120 (H) 12/21/2021   ALT 15 12/21/2021   AST 15 12/21/2021   NA 133 (L) 12/21/2021  K 3.9 12/21/2021   CL 94 (L) 12/21/2021   CREATININE 0.82 12/21/2021   BUN 17 12/21/2021   CO2 30 12/21/2021   TSH 0.69 12/21/2021   HGBA1C 9.7 (H) 12/21/2021   Assessment/Plan:  Samantha Ayers is a 53 y.o. Black or African American [2] female with  has a past medical history of Allergy, Asthma, Diabetes mellitus, Hypertension,  and Obesity.  Encounter for general adult medical examination with abnormal findings Age and sex appropriate education and counseling updated with regular exercise and diet Referrals for preventative services - for hep c screen, declines optho or colonsocopy for now Immunizations addressed - declines all Smoking counseling  - none needed Evidence for depression or other mood disorder - none significant Most recent labs reviewed. I have personally reviewed and have noted: 1) the patient's medical and social history 2) The patient's current medications and supplements 3) The patient's height, weight, and BMI have been recorded in the chart   Vitamin D  deficiency Last vitamin D  Lab Results  Component Value Date   VD25OH 31.94 12/21/2021   Low, to start oral replacement   Uncontrolled hypertension BP Readings from Last 3 Encounters:  09/18/23 114/72  11/03/22 124/68  10/25/22 126/80   Stable, pt to continue medical treatment zestoretic  20 12.5 mg qd   Type 2 diabetes mellitus with hyperglycemia, without long-term current use of insulin (HCC) Lab Results  Component Value Date   HGBA1C 9.7 (H) 12/21/2021   Uncontrolled, has been lost to f/u, for continue farxiga  10 every day, trulicity  4.5 mg weekly,metformin  ER 500 mg - 4 every day and f/u lab today   HLD (hyperlipidemia) Lab Results  Component Value Date   LDLCALC 120 (H) 12/21/2021   Uncontrolled,, pt for crestor  20 mg every day, f/u lab today   Intractable right heel pain Has bilateral plantar and post heel pain right > left - for xray and refer sport medicine, also for mobic  15 mg every day prn  Intractable left heel pain Has bilateral plantar and post heel pain right > left - for xray and refer sport medicine  Mouth lesion Approx 10 mm fleshy lesion to roof of mouth just behind the upper front teeth - for ENT referral  Followup: No follow-ups on file.  Lynwood Rush, MD 09/18/2023 8:41 PM [facility] Internal Medicine

## 2023-09-18 NOTE — Assessment & Plan Note (Signed)
 Approx 10 mm fleshy lesion to roof of mouth just behind the upper front teeth - for ENT referral

## 2023-09-18 NOTE — Assessment & Plan Note (Signed)
 Lab Results  Component Value Date   LDLCALC 120 (H) 12/21/2021   Uncontrolled,, pt for crestor  20 mg every day, f/u lab today

## 2023-09-18 NOTE — Patient Instructions (Addendum)
 Please take all new medication as prescribed - the anti inflammatory for pain  Please continue all other medications as before, and refills have been done if requested.  Please have the pharmacy call with any other refills you may need.  Please continue your efforts at being more active, low cholesterol diet, and weight control.  You are otherwise up to date with prevention measures today.  Please keep your appointments with your specialists as you may have planned  You will be contacted regarding the referral for: ENT for the lesion at the roof of the mouth behind the front teeth  You will be contacted regarding the referral for: Sports Medicine on the first floor (or you can stop at the desk yourself today to make an appt)  Please go to the XRAY Department in the first floor for the x-ray testing  Please go to the LAB at the blood drawing area for the tests to be done  You will be contacted by phone if any changes need to be made immediately.  Otherwise, you will receive a letter about your results with an explanation, but please check with MyChart first.  Please make an Appointment to return in 6 months, or sooner if needed

## 2023-09-19 LAB — HEPATITIS C ANTIBODY: Hepatitis C Ab: NONREACTIVE

## 2023-09-28 ENCOUNTER — Ambulatory Visit: Admitting: Podiatry

## 2023-09-28 ENCOUNTER — Ambulatory Visit: Payer: Self-pay | Admitting: Podiatry

## 2023-09-28 ENCOUNTER — Encounter: Payer: Self-pay | Admitting: Podiatry

## 2023-09-28 DIAGNOSIS — M7662 Achilles tendinitis, left leg: Secondary | ICD-10-CM | POA: Diagnosis not present

## 2023-09-28 DIAGNOSIS — M7661 Achilles tendinitis, right leg: Secondary | ICD-10-CM | POA: Diagnosis not present

## 2023-09-28 DIAGNOSIS — M722 Plantar fascial fibromatosis: Secondary | ICD-10-CM | POA: Diagnosis not present

## 2023-09-28 MED ORDER — TRIAMCINOLONE ACETONIDE 40 MG/ML IJ SUSP
40.0000 mg | Freq: Once | INTRAMUSCULAR | Status: AC
  Administered 2023-09-28: 40 mg

## 2023-09-28 NOTE — [place] (Signed)
 Subjective:  Patient ID: Samantha Ayers, female    DOB: 1970-11-20,  MRN: [id number] HPI Chief Complaint  Patient presents with   Foot Pain    Plantar/posterior heel bilateral - aching x several months, AM pain, PCP eval-xrayed, gave meloxicam , but hasn't seen much improvements   New Patient (Initial Visit)    Diabetic - 9.7    53 y.o. female presents with the above complaint.   ROS: Denies fever chills nausea vomiting muscle aches pains calf pain back pain chest pain shortness of breath.  Past Medical History:  Diagnosis Date   Allergy    Asthma    Diabetes mellitus    Hypertension    Obesity    Past Surgical History:  Procedure Laterality Date   CESAREAN SECTION     CESAREAN SECTION      Current Outpatient Medications:    JARDIANCE 10 MG TABS tablet, Take 10 mg by mouth every morning., Disp: , Rfl:    Blood Glucose Monitoring Suppl (ACCU-CHEK GUIDE ME) w/Device KIT, Use as directed twice per day E11.9, Disp: 1 kit, Rfl: 0   cetirizine  (ZYRTEC ) 10 MG tablet, Take 1 tablet (10 mg total) by mouth daily., Disp: 30 tablet, Rfl: 11   cyclobenzaprine  (FLEXERIL ) 5 MG tablet, Take 1 tablet by mouth three times daily as needed for muscle spasm, Disp: 180 tablet, Rfl: 0   dapagliflozin  propanediol (FARXIGA ) 10 MG TABS tablet, Take 1 tablet (10 mg total) by mouth daily before breakfast., Disp: 90 tablet, Rfl: 3   Dulaglutide  (TRULICITY ) 4.5 MG/0.5ML SOAJ, INJECT 4.5 MG INTO THE SKIN ONCE A WEEK AS DIRECTED, Disp: 4 mL, Rfl: 0   glucose blood (ACCU-CHEK GUIDE) test strip, Use as instructed twice per day E11.9, Disp: 200 each, Rfl: 12   ibuprofen  (ADVIL ) 800 MG tablet, Take 1 tablet (800 mg total) by mouth every 8 (eight) hours as needed., Disp: 30 tablet, Rfl: 0   Lancets Misc. (ONE TOUCH SURESOFT) MISC, Use 1 lancet per test. Test blood sugars 1-4 times per day as instructed., Disp: 1 each, Rfl: 1   Lancets MISC, Use as directed twice per day E11.9, Disp: 200 each, Rfl: 3    lisinopril -hydrochlorothiazide  (ZESTORETIC ) 20-12.5 MG tablet, Take 2 tablets by mouth daily., Disp: 180 tablet, Rfl: 0   meclizine  (ANTIVERT ) 12.5 MG tablet, TAKE 1 TABLET BY MOUTH THREE TIMES DAILY AS NEEDED FOR DIZZINESS, Disp: 40 tablet, Rfl: 0   meloxicam  (MOBIC ) 15 MG tablet, Take 1 tablet (15 mg total) by mouth daily as needed., Disp: 90 tablet, Rfl: 1   metFORMIN  (GLUCOPHAGE -XR) 500 MG 24 hr tablet, TAKE 4 TABLETS BY MOUTH ONCE DAILY WITH BREAKFAST, Disp: 360 tablet, Rfl: 0   rosuvastatin  (CRESTOR ) 20 MG tablet, Take 1 tablet (20 mg total) by mouth daily. (Patient not taking: Reported on 09/18/2023), Disp: 90 tablet, Rfl: 3  No Known Allergies Review of Systems Objective:  There were no vitals filed for this visit.  General: Well developed, nourished, in no acute distress, alert and oriented x3   Dermatological: Skin is warm, dry and supple bilateral. Nails x 10 are well maintained; remaining integument appears unremarkable at this time. There are no open sores, no preulcerative lesions, no rash or signs of infection present.  Vascular: Dorsalis Pedis artery and Posterior Tibial artery pedal pulses are 2/4 bilateral with immedate capillary fill time. Pedal hair growth present. No varicosities and no lower extremity edema present bilateral.   Neruologic: Grossly intact via light touch bilateral. Vibratory intact via  tuning fork bilateral. Protective threshold with Semmes Wienstein monofilament intact to all pedal sites bilateral. Patellar and Achilles deep tendon reflexes 2+ bilateral. No Babinski or clonus noted bilateral.   Musculoskeletal: No gross boney pedal deformities bilateral. No pain, crepitus, or limitation noted with foot and ankle range of motion bilateral. Muscular strength 5/5 in all groups tested bilateral.  She has moderate to severe pain on palpation of the Achilles tendons bilaterally as they insert on the calcaneus.  There is considerable swelling and warmth in the area.   The majority of the pain however is at the plantar fascial calcaneal insertion sites bilaterally.  With pes planovalgus bilateral.  Gait: Unassisted, Nonantalgic.    Radiographs:  Radiographs taken demonstrate osseously mature individual large retrocalcaneal thickening of the Achilles.  Bursitis is most likely present.  Soft tissue increase in density of plantar fascia at the insertion site indicative of plantar fasciitis.  Assessment & Plan:   Assessment: Achilles tendinitis and plantar fasciitis.  This is bilateral.  Plan: Discussed appropriate shoe gear stretching exercise ice therapy shoe gear modifications.  Discussed etiology pathology conservative versus surgical therapies.  I injected the bilateral heels today 20 mg Kenalog  milligrams Marcaine point maximal tenderness.  Tolerated procedure well without complications..  Will consider injections to the bursa next visit.     Bellarae Lizer T. [place], [place]

## 2023-10-03 ENCOUNTER — Ambulatory Visit: Admitting: Sports Medicine

## 2023-10-12 ENCOUNTER — Encounter (INDEPENDENT_AMBULATORY_CARE_PROVIDER_SITE_OTHER): Payer: Self-pay

## 2023-10-12 ENCOUNTER — Ambulatory Visit (INDEPENDENT_AMBULATORY_CARE_PROVIDER_SITE_OTHER)

## 2023-10-12 VITALS — BP 161/84 | HR 87

## 2023-10-12 DIAGNOSIS — K1379 Other lesions of oral mucosa: Secondary | ICD-10-CM

## 2023-10-12 DIAGNOSIS — J301 Allergic rhinitis due to pollen: Secondary | ICD-10-CM

## 2023-10-12 MED ORDER — MONTELUKAST SODIUM 10 MG PO TABS: 10.0000 mg | ORAL_TABLET | Freq: Every day | ORAL | 3 refills | Status: AC

## 2023-10-12 NOTE — [place] Notes (Signed)
 Dear Dr. Norleen, Here is my assessment for our mutual patient, Samantha Ayers. Thank you for allowing me the opportunity to care for your patient. Please do not hesitate to contact me should you have any other questions. Sincerely, Dr. Penne Croak  Otolaryngology Clinic Note Referring provider: Dr. Norleen HPI:  Samantha Ayers is a 53 y.o. female kindly referred by Dr. Norleen for evaluation of oral lesion. Noticed 3 weeks ago. No pain. No tobacco use or ETOH. No bleeding. Located midline hard/soft palate junction ~1cm in size. She reports a trauma 3 weeks eating chicken bones.   She has a picture taken from several weeks ago.  Works at [employer]  Independent Review of Additional Tests or Records:  I personally reviewed Dr. Naomi note  PMH/Meds/All/SocHx/FamHx/ROS:   Past Medical History:  Diagnosis Date   Allergy    Asthma    Diabetes mellitus    Hypertension    Obesity      Past Surgical History:  Procedure Laterality Date   CESAREAN SECTION     CESAREAN SECTION      Family History  Problem Relation Age of Onset   Diabetes Mother    Diabetes Father    Breast cancer Neg Hx      Social Connections: Not on file      Current Outpatient Medications:    montelukast  (SINGULAIR ) 10 MG tablet, Take 1 tablet (10 mg total) by mouth at bedtime., Disp: 30 tablet, Rfl: 3   Blood Glucose Monitoring Suppl (ACCU-CHEK GUIDE ME) w/Device KIT, Use as directed twice per day E11.9, Disp: 1 kit, Rfl: 0   cetirizine  (ZYRTEC ) 10 MG tablet, Take 1 tablet (10 mg total) by mouth daily., Disp: 30 tablet, Rfl: 11   cyclobenzaprine  (FLEXERIL ) 5 MG tablet, Take 1 tablet by mouth three times daily as needed for muscle spasm, Disp: 180 tablet, Rfl: 0   dapagliflozin  propanediol (FARXIGA ) 10 MG TABS tablet, Take 1 tablet (10 mg total) by mouth daily before breakfast., Disp: 90 tablet, Rfl: 3   Dulaglutide  (TRULICITY ) 4.5 MG/0.5ML SOAJ, INJECT 4.5 MG INTO THE SKIN ONCE A WEEK AS DIRECTED, Disp:  4 mL, Rfl: 0   glucose blood (ACCU-CHEK GUIDE) test strip, Use as instructed twice per day E11.9, Disp: 200 each, Rfl: 12   ibuprofen  (ADVIL ) 800 MG tablet, Take 1 tablet (800 mg total) by mouth every 8 (eight) hours as needed., Disp: 30 tablet, Rfl: 0   JARDIANCE 10 MG TABS tablet, Take 10 mg by mouth every morning., Disp: , Rfl:    Lancets Misc. (ONE TOUCH SURESOFT) MISC, Use 1 lancet per test. Test blood sugars 1-4 times per day as instructed., Disp: 1 each, Rfl: 1   Lancets MISC, Use as directed twice per day E11.9, Disp: 200 each, Rfl: 3   lisinopril -hydrochlorothiazide  (ZESTORETIC ) 20-12.5 MG tablet, Take 2 tablets by mouth daily., Disp: 180 tablet, Rfl: 0   meclizine  (ANTIVERT ) 12.5 MG tablet, TAKE 1 TABLET BY MOUTH THREE TIMES DAILY AS NEEDED FOR DIZZINESS, Disp: 40 tablet, Rfl: 0   meloxicam  (MOBIC ) 15 MG tablet, Take 1 tablet (15 mg total) by mouth daily as needed., Disp: 90 tablet, Rfl: 1   metFORMIN  (GLUCOPHAGE -XR) 500 MG 24 hr tablet, TAKE 4 TABLETS BY MOUTH ONCE DAILY WITH BREAKFAST, Disp: 360 tablet, Rfl: 0   rosuvastatin  (CRESTOR ) 20 MG tablet, Take 1 tablet (20 mg total) by mouth daily. (Patient not taking: Reported on 09/18/2023), Disp: 90 tablet, Rfl: 3   Physical Exam:   BP ROLLEN)  161/84   Pulse 87   LMP 05/30/2023 (Approximate)   SpO2 98%   Salient findings:  General: awake, alert, pleasant HEENT: NCAT, clear sclera, no nystagmus, pinna without external lesions, EAC clear, TM demonstrating normal landmarks and aerated middle ear, septum midline, bilateral inferior turbinates with no edema, dentition fair, some cavities and missing teeth present, symmetric tongue and palate movement, small purple discoloration to the midline hard and soft palate. Flat, nontender, ~1cm in diameter without sharp margin. No telangictasias Neck: trachea midline, no masses/ymphadenopathy/thyromegaly CN II-XII grossly intact No respiratory distress or stridor  Seprately Identifiable Procedures:   none  Impression & Plans:  [name] is a 53 y.o. female with a flat palate lesion.   1. Lesion of palate   2. Seasonal allergic rhinitis due to pollen    - she reports that the lesion is improving and looks smaller  Plan - today's findings were discussed with the patient - today's diagnoses were discussed in detail with the patient - Discussed observation vs biopsy. I looked at a previous picture of the lesion compared to today and it appears to be improving. Plan to observe and reevaluate in one month. Leading differential is that this was caused by trauma.  - singulair  rx given to patient to try for seasonal allergies  - No orders of the defined types were placed in this encounter.   - See below regarding exact medications prescribed this encounter including dosages and route: Meds ordered this encounter  Medications   montelukast  (SINGULAIR ) 10 MG tablet    Sig: Take 1 tablet (10 mg total) by mouth at bedtime.    Dispense:  30 tablet    Refill:  3   - f/u one month to recheck on today's diagnoses.  Thank you for allowing me the opportunity to care for your patient. Please do not hesitate to contact me should you have any other questions.  Sincerely, Penne Croak, DO Otolaryngologist (ENT) [facility] Phone: [phone] Fax: [phone]  10/12/2023, 1:15 PM

## 2023-10-19 ENCOUNTER — Ambulatory Visit: Admitting: Podiatry

## 2023-11-24 ENCOUNTER — Ambulatory Visit (INDEPENDENT_AMBULATORY_CARE_PROVIDER_SITE_OTHER)

## 2023-11-26 ENCOUNTER — Other Ambulatory Visit: Payer: Self-pay | Admitting: Internal Medicine

## 2023-11-27 ENCOUNTER — Other Ambulatory Visit: Payer: Self-pay

## 2023-12-21 ENCOUNTER — Ambulatory Visit: Admitting: Podiatry

## 2023-12-21 ENCOUNTER — Encounter: Payer: Self-pay | Admitting: Podiatry

## 2023-12-21 DIAGNOSIS — I82402 Acute embolism and thrombosis of unspecified deep veins of left lower extremity: Secondary | ICD-10-CM

## 2023-12-21 DIAGNOSIS — S86012A Strain of left Achilles tendon, initial encounter: Secondary | ICD-10-CM | POA: Diagnosis not present

## 2023-12-21 DIAGNOSIS — S93692A Other sprain of left foot, initial encounter: Secondary | ICD-10-CM | POA: Diagnosis not present

## 2023-12-21 MED ORDER — GABAPENTIN 300 MG PO CAPS: 300.0000 mg | ORAL_CAPSULE | Freq: Three times a day (TID) | ORAL | 3 refills | Status: AC

## 2023-12-21 MED ORDER — CYCLOBENZAPRINE HCL 10 MG PO TABS: 10.0000 mg | ORAL_TABLET | Freq: Three times a day (TID) | ORAL | 0 refills | Status: AC | PRN

## 2023-12-21 MED ORDER — IBUPROFEN 800 MG PO TABS: 800.0000 mg | ORAL_TABLET | Freq: Three times a day (TID) | ORAL | 5 refills | Status: AC

## 2023-12-22 ENCOUNTER — Other Ambulatory Visit: Payer: Self-pay | Admitting: Lab

## 2023-12-22 ENCOUNTER — Encounter: Payer: Self-pay | Admitting: Podiatry

## 2023-12-22 ENCOUNTER — Ambulatory Visit (HOSPITAL_COMMUNITY)
Admission: RE | Admit: 2023-12-22 | Discharge: 2023-12-22 | Disposition: A | Discharge status: Home / Self Care | Source: Ambulatory Visit | Attending: Podiatry | Admitting: Podiatry

## 2023-12-22 ENCOUNTER — Encounter: Payer: Self-pay | Admitting: Vascular Surgery

## 2023-12-22 DIAGNOSIS — I82401 Acute embolism and thrombosis of unspecified deep veins of right lower extremity: Secondary | ICD-10-CM | POA: Diagnosis present

## 2023-12-22 DIAGNOSIS — I82402 Acute embolism and thrombosis of unspecified deep veins of left lower extremity: Secondary | ICD-10-CM | POA: Diagnosis present

## 2023-12-22 NOTE — [place] Notes (Signed)
 She presents today for follow-up of her Achilles tendinitis bilateral states that the injections did not work I think it actually made it worse.  She states that I can barely walk.  Objective: Vital signs stable oriented x 3 pulses are palpable.  Obviously in distress with a slow ambulatory antalgic gait.  She has pain on palpation of the left calf with swelling in the left ankle she also has tenderness along the Achilles primarily on the left side versus the right.  She has very little in the range of motion at the ankle joint appears to be more spasm of the Achilles I cannot really tell if this is spasming of the Achilles or a possible blood clot it is warm to the touch and exquisitely tender on palpation.  I also question whether she has a possible tear due to this chronic spastic state her left Achilles.  Her right Achilles is similar but not to the same severity.  Assessment: Possible DVT left calf.  Possible tear of the Achilles left.  Plan: Discussed etiology pathology and surgical therapies at this point I am requesting a stat ultrasound to rule out DVT left also possible MRI to rule out possible tear of the left Achilles necessitating surgical intervention.  Started her on ibuprofen  800 mg which she used to take and was doing well on.  Started her back on her Flexeril  5 mg 1 3 times a day if needed and gabapentin 300 mg 3 times a day if needed start at night and taper up over 2 to 3 weeks.  She understands these instructions and we will follow-up with her with the results of these tests once they are made ready.

## 2023-12-26 ENCOUNTER — Ambulatory Visit: Payer: Self-pay | Admitting: Podiatry

## 2023-12-26 NOTE — [place] Notes (Signed)
 Patient aware

## 2024-01-03 ENCOUNTER — Inpatient Hospital Stay: Admission: RE | Admit: 2024-01-03 | Source: Ambulatory Visit

## 2024-01-13 ENCOUNTER — Ambulatory Visit
Admission: RE | Admit: 2024-01-13 | Discharge: 2024-01-13 | Disposition: A | Source: Ambulatory Visit | Attending: Podiatry | Admitting: Podiatry

## 2024-01-13 DIAGNOSIS — S93692A Other sprain of left foot, initial encounter: Secondary | ICD-10-CM

## 2024-01-13 DIAGNOSIS — S86012A Strain of left Achilles tendon, initial encounter: Secondary | ICD-10-CM

## 2024-01-16 ENCOUNTER — Ambulatory Visit: Admitting: Podiatry

## 2024-01-22 ENCOUNTER — Ambulatory Visit: Payer: Self-pay | Admitting: Podiatry

## 2024-02-06 ENCOUNTER — Other Ambulatory Visit: Payer: Self-pay | Admitting: Internal Medicine

## 2024-02-13 ENCOUNTER — Ambulatory Visit: Admitting: Podiatry

## 2024-02-20 ENCOUNTER — Ambulatory Visit: Admitting: Podiatry
